# Patient Record
Sex: Male | Born: 1950 | Race: Black or African American | Hispanic: No | Marital: Married | Smoking: Current every day smoker
Health system: Southern US, Community
[De-identification: ages and names within clinical notes are randomized; demographics above are authoritative.]

## PROBLEM LIST (undated history)

## (undated) DIAGNOSIS — I482 Chronic atrial fibrillation, unspecified: Secondary | ICD-10-CM

## (undated) DIAGNOSIS — I10 Essential (primary) hypertension: Secondary | ICD-10-CM

## (undated) DIAGNOSIS — N170 Acute kidney failure with tubular necrosis: Secondary | ICD-10-CM

## (undated) DIAGNOSIS — E119 Type 2 diabetes mellitus without complications: Secondary | ICD-10-CM

## (undated) DIAGNOSIS — I5082 Biventricular heart failure: Secondary | ICD-10-CM

## (undated) DIAGNOSIS — J449 Chronic obstructive pulmonary disease, unspecified: Secondary | ICD-10-CM

## (undated) DIAGNOSIS — I2699 Other pulmonary embolism without acute cor pulmonale: Secondary | ICD-10-CM

## (undated) DIAGNOSIS — C3490 Malignant neoplasm of unspecified part of unspecified bronchus or lung: Secondary | ICD-10-CM

## (undated) DIAGNOSIS — J9621 Acute and chronic respiratory failure with hypoxia: Principal | ICD-10-CM

## (undated) DIAGNOSIS — E785 Hyperlipidemia, unspecified: Secondary | ICD-10-CM

## (undated) DIAGNOSIS — J189 Pneumonia, unspecified organism: Secondary | ICD-10-CM

## (undated) DIAGNOSIS — Y95 Nosocomial condition: Secondary | ICD-10-CM

## (undated) HISTORY — PX: APPENDECTOMY: SHX54

## (undated) HISTORY — DX: Acute kidney failure with tubular necrosis: N17.0

## (undated) HISTORY — DX: Hyperlipidemia, unspecified: E78.5

## (undated) HISTORY — DX: Pneumonia, unspecified organism: J18.9

## (undated) HISTORY — DX: Essential (primary) hypertension: I10

## (undated) HISTORY — DX: Biventricular heart failure: I50.82

## (undated) HISTORY — DX: Acute and chronic respiratory failure with hypoxia: J96.21

## (undated) HISTORY — DX: Malignant neoplasm of unspecified part of unspecified bronchus or lung: C34.90

## (undated) HISTORY — PX: OTHER SURGICAL HISTORY: SHX169

## (undated) HISTORY — PX: TRACHEOSTOMY: SUR1362

## (undated) HISTORY — DX: Chronic obstructive pulmonary disease, unspecified: J44.9

## (undated) HISTORY — DX: Nosocomial condition: Y95

## (undated) HISTORY — PX: THORACOSCOPY: SUR1347

## (undated) HISTORY — DX: Other pulmonary embolism without acute cor pulmonale: I26.99

## (undated) HISTORY — DX: Type 2 diabetes mellitus without complications: E11.9

## (undated) HISTORY — DX: Chronic atrial fibrillation, unspecified: I48.20

---

## 2010-08-16 ENCOUNTER — Ambulatory Visit: Payer: Self-pay | Admitting: Family Medicine

## 2011-08-12 IMAGING — US US EXTREM LOW VENOUS*L*
1 series · 17 of 24 positions shown · non-contrast
Comparison: none

REASON FOR EXAM: STAT CR# [id number] Eval for DVT
COMMENTS:

[Series 1: us extrem low venous*left* · 17 of 36 slices shown]
[im 1/36]
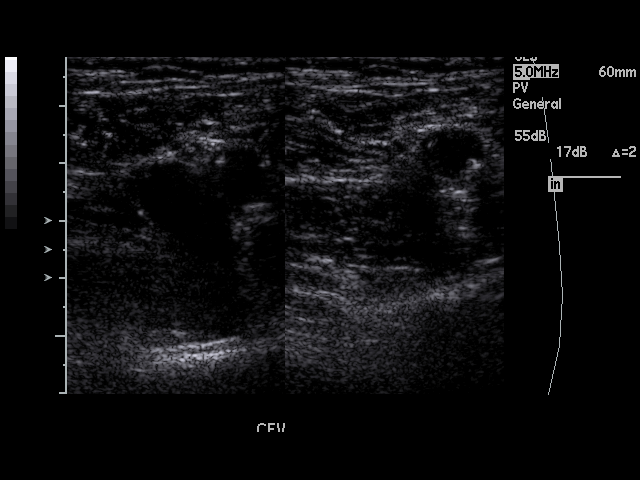
[im 4/36]
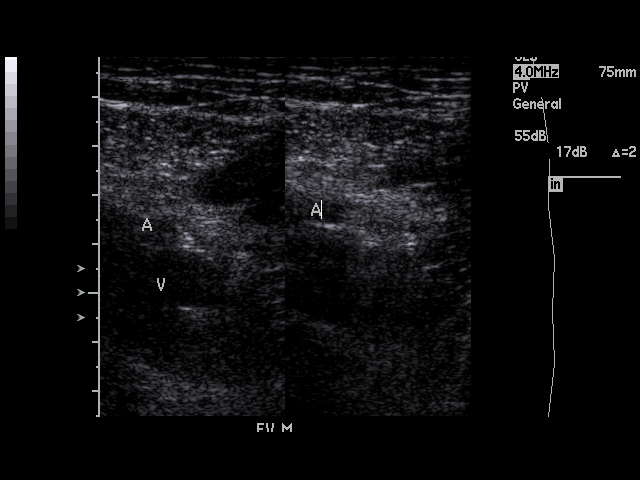
[im 5/36]
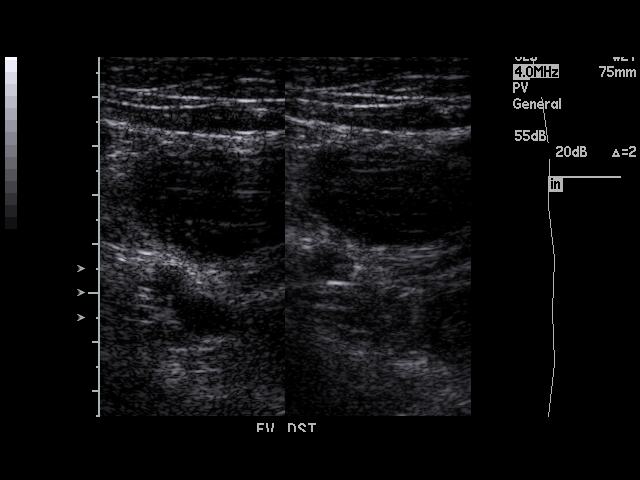
[im 7/36]
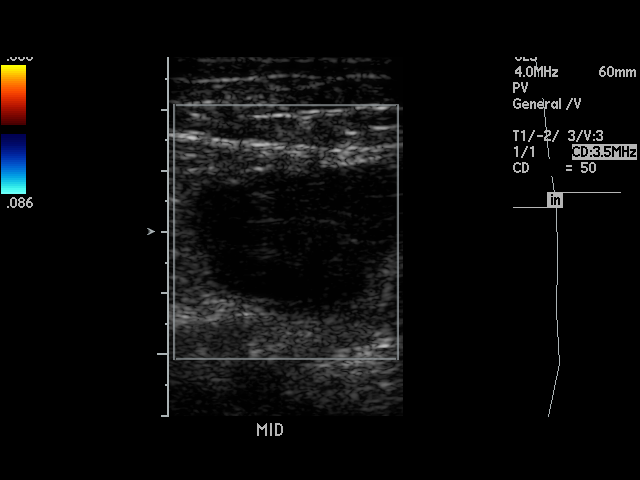
[im 10/36]
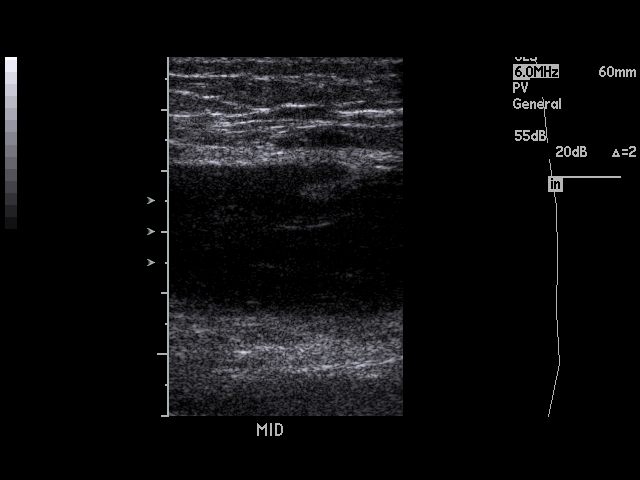
[im 11/36]
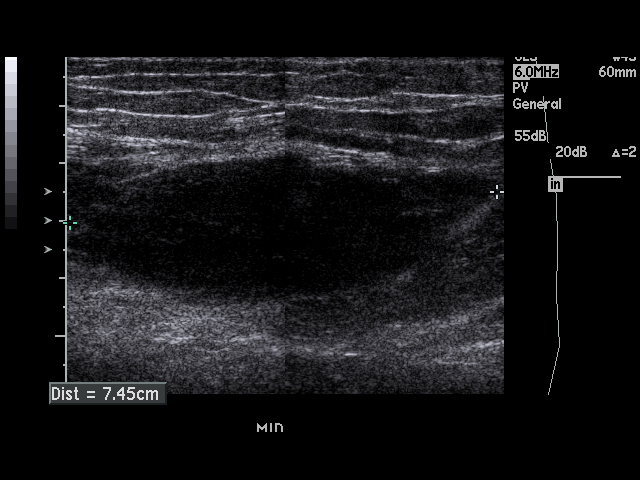
[im 14/36]
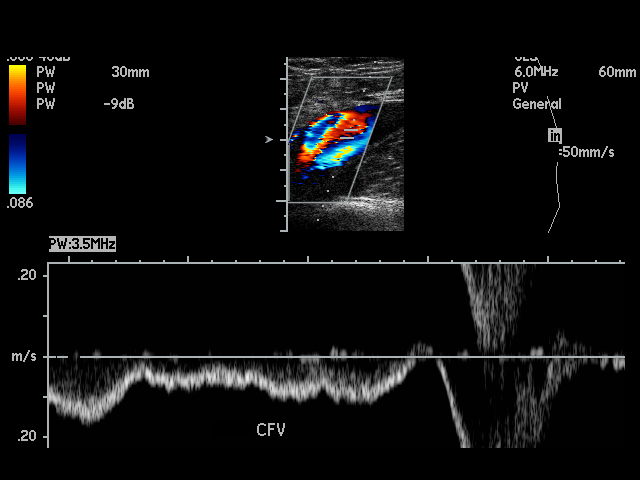
[im 16/36]
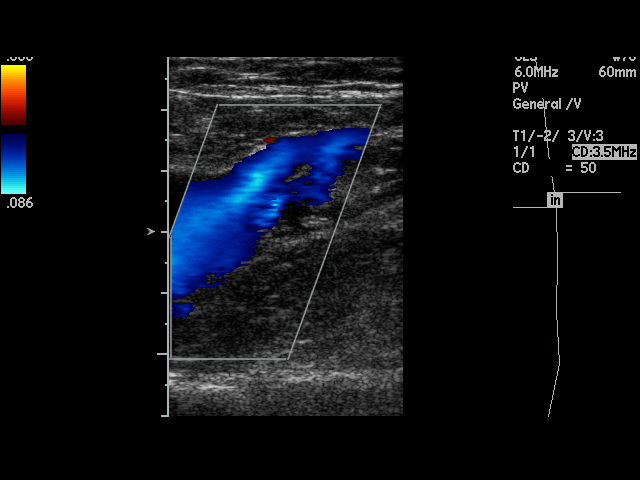
[im 19/36]
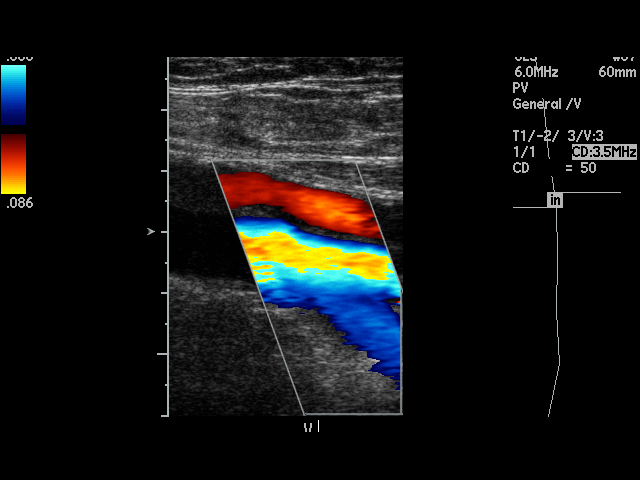
[im 20/36]
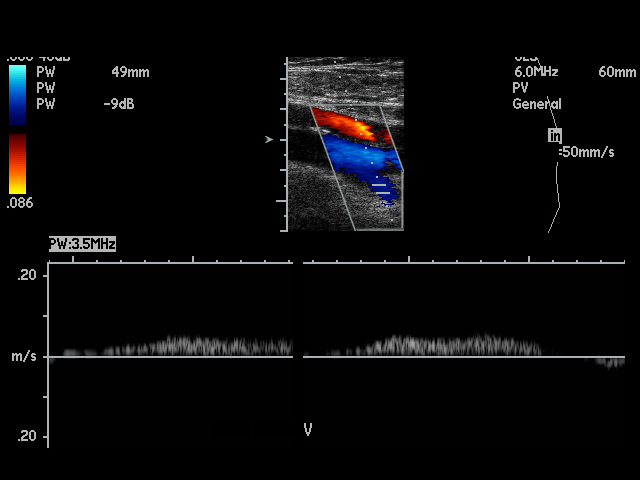
[im 22/36]
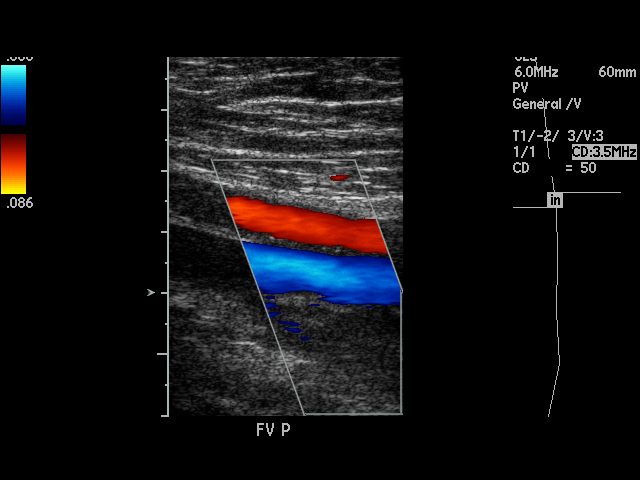
[im 25/36]
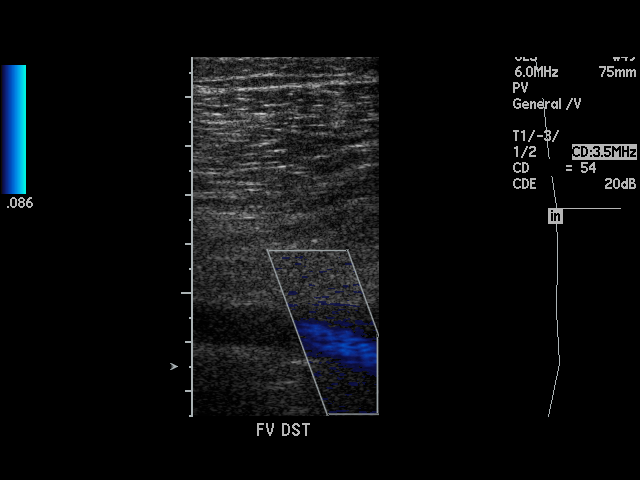
[im 26/36]
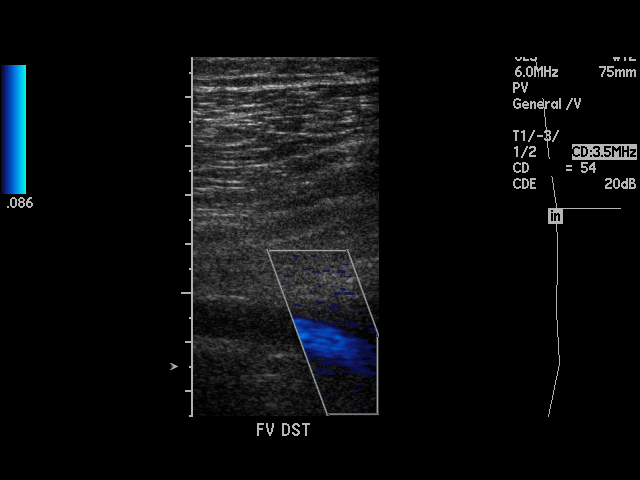
[im 29/36]
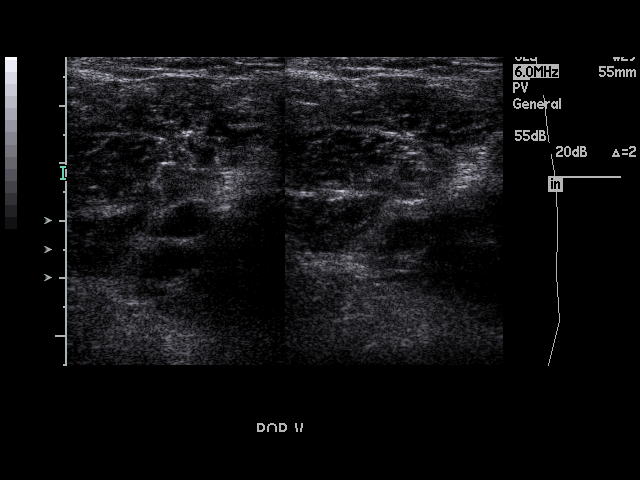
[im 31/36]
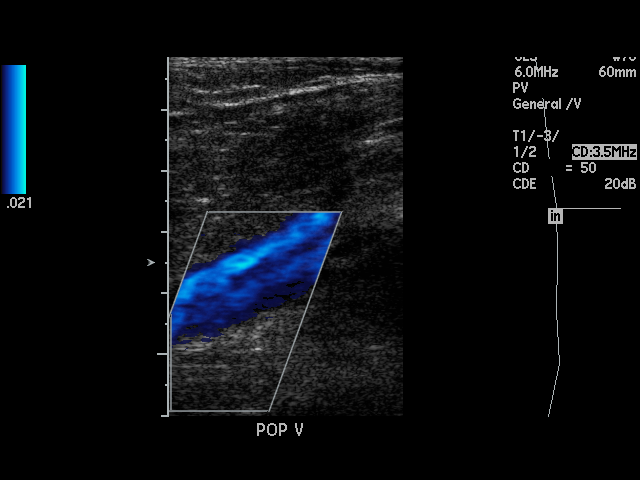
[im 32/36]
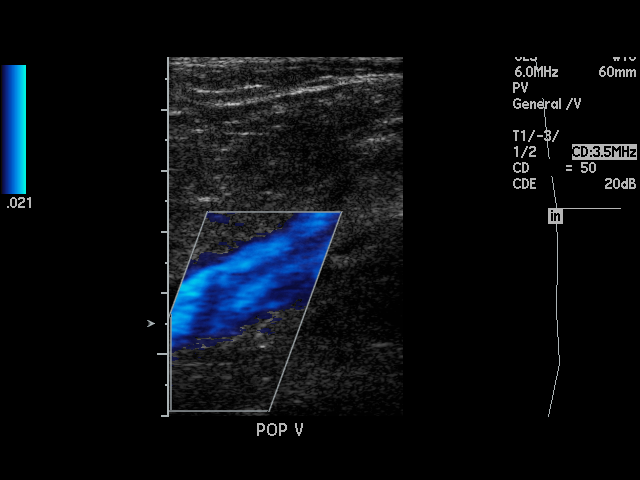
[im 36/36]
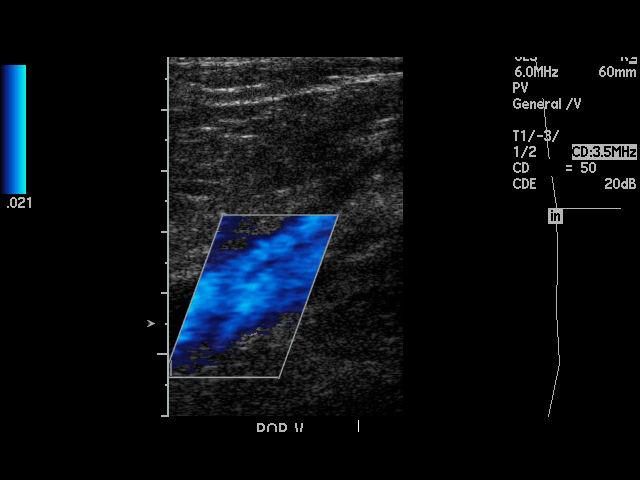

[17 of 24 positions shown; findings below may reference images not displayed]

PROCEDURE:     US  - US DOPPLER LOW EXTR LEFT  - August 16, 2010  [DATE]

RESULT:     The phasic, augmentation and Valsalva flow waveforms are normal
in appearance. The femoral and popliteal vein shows complete compressibility
throughout its course. Doppler examination shows no occlusion or evidence
for deep vein thrombosis.

In the mid thigh region there is noted a hypoechoic soft tissue mass
measuring approximately 3.65 cm x 2.32 cm x 7.45 cm longitudinally. In view
of the patient's history of recent trauma, the finding most likely
represents a fluid collection such as hematoma or serous collection.
IMPRESSION: 1. No deep vein thrombosis is identified.
2. There is what appears to be a soft tissue fluid collection as noted above.

## 2016-09-04 ENCOUNTER — Other Ambulatory Visit: Payer: Self-pay | Admitting: Family Medicine

## 2016-09-04 DIAGNOSIS — Z136 Encounter for screening for cardiovascular disorders: Secondary | ICD-10-CM

## 2018-05-13 ENCOUNTER — Encounter: Payer: Self-pay | Admitting: Internal Medicine

## 2018-05-13 ENCOUNTER — Other Ambulatory Visit (HOSPITAL_COMMUNITY): Payer: Medicare HMO | Admitting: Internal Medicine

## 2018-05-13 DIAGNOSIS — I5082 Biventricular heart failure: Secondary | ICD-10-CM

## 2018-05-13 DIAGNOSIS — I2699 Other pulmonary embolism without acute cor pulmonale: Secondary | ICD-10-CM

## 2018-05-13 DIAGNOSIS — I482 Chronic atrial fibrillation, unspecified: Secondary | ICD-10-CM | POA: Insufficient documentation

## 2018-05-13 DIAGNOSIS — Y95 Nosocomial condition: Secondary | ICD-10-CM

## 2018-05-13 DIAGNOSIS — J449 Chronic obstructive pulmonary disease, unspecified: Secondary | ICD-10-CM | POA: Diagnosis not present

## 2018-05-13 DIAGNOSIS — J9621 Acute and chronic respiratory failure with hypoxia: Secondary | ICD-10-CM

## 2018-05-13 DIAGNOSIS — J189 Pneumonia, unspecified organism: Secondary | ICD-10-CM

## 2018-05-13 DIAGNOSIS — N170 Acute kidney failure with tubular necrosis: Secondary | ICD-10-CM | POA: Diagnosis not present

## 2018-05-13 HISTORY — DX: Other pulmonary embolism without acute cor pulmonale: I26.99

## 2018-05-13 HISTORY — DX: Biventricular heart failure: I50.82

## 2018-05-13 HISTORY — DX: Acute and chronic respiratory failure with hypoxia: J96.21

## 2018-05-13 HISTORY — DX: Pneumonia, unspecified organism: J18.9

## 2018-05-13 HISTORY — DX: Acute kidney failure with tubular necrosis: N17.0

## 2018-05-13 HISTORY — DX: Chronic atrial fibrillation, unspecified: I48.20

## 2018-05-13 NOTE — Progress Notes (Signed)
[place]  Date of Service: 05/13/2018  PULMONARY CONSULT   Matthew Villarreal  GYF:[id number]  DOB: September 23, 1951     Referring Physician: Deanne Coffer, MD  HPI: Matthew Villarreal is a 67 y.o. male seen for Acute on Chronic Respiratory Failure.  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  Patient also had chemotherapy done.  Patient in addition has a history of COPD ongoing tobacco abuse.  Patient was admitted with the severe left-sided chest pain and underwent a CT angiogram.  The angiogram revealed bilateral pulmonary emboli and right heart strain.  Patient was taken to the interventional radiologist and had a attempted pulmonary embolectomy.  During initiation of procedure patient decompensated and went into pulseless electrical activity.  There was spontaneous circulation after few compression and the arm patient suffered once again 2 more brief episodes of arrest.  Subsequently procedure did over to mainly end up in the 6s with removal of the clots and improvement of the right heart strain.  Patient did require ECMO and was admitted to the ICU requiring pressors.  Patient was continued on the ventilator and eventually ended up having to have a tracheostomy.  Patient also developed acute renal failure because of the low blood pressure and was started on CRRT.  Subsequently patient was started on hemodialysis.  Patient also developed hospital-acquired pneumonia and was treated with antibiotics including cefepime and Zosyn.  Follow-up echocardiogram revealed an ejection fraction 25-35% and also right ventricular dysfunction.  Patient was presented to our facility for further management weaning.  Patient also has been on T collar trials and has done well so far.  Review of Systems:  ROS performed and is unremarkable other than noted above.  Past Medical History:  Diagnosis Date  . Acute  on chronic respiratory failure with hypoxia ([place]) 05/13/2018  . Acute renal failure due to tubular necrosis ([place]) 05/13/2018  . Atrial fibrillation, chronic ([place]) 05/13/2018  . Biventricular heart failure ([place]) 05/13/2018  . COPD (chronic obstructive pulmonary disease) ([place])   . Diabetes type 2, controlled ([place])   . Hospital-acquired pneumonia 05/13/2018  . Hyperlipidemia   . Hypertension   . Lung cancer ([place])   . Pulmonary embolism and infarction ([place]) 05/13/2018    Past Surgical History:  Procedure Laterality Date  .  right upper lobectomy    . APPENDECTOMY    . THORACOSCOPY    . TRACHEOSTOMY      Social History:    reports that he has been smoking.  He has never used smokeless tobacco. He reports that he drank alcohol. He reports that he has current or past drug history.  Family History: Non-Contributory to the present illness  Allergies  Reviewed on the [facility]  Medications: Reviewed on Rounds  Physical Exam:  Vitals:   Temperature 97.9 degrees pulse 88 respiratory 18 blood pressure 110/60 saturations 100%  Ventilator Settings  Currently off the ventilator on T collar 35% FiO2  . General: Comfortable at this time . Eyes: Grossly normal lids, irises & conjunctiva . ENT: grossly tongue is normal . Neck: no obvious mass . Cardiovascular:  S1-S2 normal no gallop or rub . Respiratory:  Scattered rhonchi no rales . Abdomen:  Soft nontender . Skin: no rash seen on limited exam . Musculoskeletal: not rigid . Psychiatric:unable to assess . Neurologic: no seizure no involuntary movements         Labs on Admission:  white count 9.6 hemoglobin 8.6 hematocrit 26.9 platelet count 260 Sodium 136 potassium 4.3 BUN 100 creatinine 5.8  Blood gas pH 7.35 pCO2 47 PO2 57  Radiological Exams on Admission:  chest x-Trystyn results were reviewed  Assessment/Plan Patient Active Problem List   Diagnosis Date Noted  . Acute on chronic respiratory failure with hypoxia ([place]) 05/13/2018  .  Pulmonary embolism and infarction ([place]) 05/13/2018  . Hospital-acquired pneumonia 05/13/2018  . Biventricular heart failure ([place]) 05/13/2018  . Acute renal failure due to tubular necrosis ([place]) 05/13/2018  . Atrial fibrillation, chronic ([place]) 05/13/2018  . COPD (chronic obstructive pulmonary disease) (H[facility])      1.  acute on chronic Respiratory failure with hypoxia continue with full support current settings patient is tolerating T collar fairly well has been on 28% FiO2.  Blood gas results as noted.  Patient's seems to have chronic hypercapnia currently is compensated.  We will monitor secretions continue aggressive pulmonary toilet overall patient prognosis remains guarded.   2. Pulmonary embolism and infarction treated had acute intervention by Radiology to remove the clots.  Doing better we will continue with present management.   3. Hospital-acquired pneumonia treated doing better we will continue follow  4. Biventricular heart failure compensated will monitor fluid status closely.   5. Acute renal failure patient's creatinine levels are still elevated followed by Nephrology for dialysis.   6. Chronic atrial fibrillation rate is controlled continue present management.   7. COPD severe disease we will continue supportive care  I have personally seen and evaluated the patient, evaluated laboratory and imaging results, formulated the assessment and plan and placed orders. The Patient requires high complexity decision making for assessment and support.  Case was discussed on Rounds with the Respiratory Therapy Staff Time Spent 67minutes  Allyne Gee, MD [facility]

## 2018-05-14 ENCOUNTER — Other Ambulatory Visit (HOSPITAL_COMMUNITY): Payer: Medicare HMO | Admitting: Internal Medicine

## 2018-05-14 DIAGNOSIS — J449 Chronic obstructive pulmonary disease, unspecified: Secondary | ICD-10-CM | POA: Diagnosis not present

## 2018-05-14 DIAGNOSIS — Y95 Nosocomial condition: Secondary | ICD-10-CM

## 2018-05-14 DIAGNOSIS — J189 Pneumonia, unspecified organism: Secondary | ICD-10-CM

## 2018-05-14 DIAGNOSIS — N170 Acute kidney failure with tubular necrosis: Secondary | ICD-10-CM

## 2018-05-14 DIAGNOSIS — I482 Chronic atrial fibrillation, unspecified: Secondary | ICD-10-CM

## 2018-05-14 DIAGNOSIS — I5082 Biventricular heart failure: Secondary | ICD-10-CM | POA: Diagnosis not present

## 2018-05-14 DIAGNOSIS — I2699 Other pulmonary embolism without acute cor pulmonale: Secondary | ICD-10-CM

## 2018-05-14 DIAGNOSIS — J9621 Acute and chronic respiratory failure with hypoxia: Secondary | ICD-10-CM | POA: Diagnosis not present

## 2018-05-14 NOTE — Progress Notes (Signed)
[place] NOTE  PULMONARY SERVICE ROUNDS  Date of Service: 05/14/2018  Matthew Villarreal  DOB: 27-Sep-1951  Referring physician: Deanne Coffer, MD  HPI: Matthew Villarreal is a 67 y.o. male  being seen for Acute on Chronic Respiratory Failure.  Patient is resting comfortably on T collar currently on 35% FiO2 patient is weaning tolerating it well.  Review of Systems: Unremarkable other than noted in HPI  Allergies:  Reviewed on the [facility]  Medications: Reviewed  Vitals: Temperature 97.6 pulse 93 respiratory rate 16 blood pressure 110/60 saturations 100%  Ventilator Settings: Off the ventilator on T collar  Physical Exam: . General:  calm and comfortable NAD . Eyes: normal lids, irises & conjunctiva . ENT: grossly normal tongue not enlarged . Neck: no masses . Cardiovascular: S1 S2 Normal no rubs no gallop . Respiratory: Coarse rhonchi noted bilaterally . Abdomen: soft non-distended . Skin: no rash seen on limited exam . Musculoskeletal:  no rigidity . Psychiatric: unable to assess . Neurologic: no involuntary movements          Lab Data and radiological Data:  Data reviewed   Assessment/Plan  Patient Active Problem List   Diagnosis Date Noted  . Acute on chronic respiratory failure with hypoxia ([place]) 05/13/2018  . Pulmonary embolism and infarction ([place]) 05/13/2018  . Hospital-acquired pneumonia 05/13/2018  . Biventricular heart failure ([place]) 05/13/2018  . Acute renal failure due to tubular necrosis ([place]) 05/13/2018  . Atrial fibrillation, chronic ([place]) 05/13/2018  . COPD (chronic obstructive pulmonary disease) ([place])       1. Acute on chronic respiratory failure with hypoxia we will continue with T collar and wean as tolerated.  Patient was discussed on rounds has been doing well so far. 2. Pulmonary embolism infarction stable we will continue present management. 3. Hospital-acquired pneumonia treated we will continue to follow 4. Biventricular  failure patient's compensated right now 5. Chronic atrial fibrillation rate is controlled 6. Acute renal failure follow labs nephrology recommendations 7. COPD advanced disease continue present management   I have personally evaluated the patient, evaluated the laboratory and imaging results and formulated the assessment and plan and placed orders as needed. The Patient requires high complexity decision making for assessment and support. I have discussed the patient on rounds with the Respiratory Staff   Allyne Gee, MD [facility] Pulmonary Critical Care Medicine

## 2018-05-15 ENCOUNTER — Other Ambulatory Visit (HOSPITAL_COMMUNITY): Payer: Medicare HMO | Admitting: Internal Medicine

## 2018-05-15 DIAGNOSIS — I482 Chronic atrial fibrillation, unspecified: Secondary | ICD-10-CM

## 2018-05-15 DIAGNOSIS — I2699 Other pulmonary embolism without acute cor pulmonale: Secondary | ICD-10-CM | POA: Diagnosis not present

## 2018-05-15 DIAGNOSIS — J449 Chronic obstructive pulmonary disease, unspecified: Secondary | ICD-10-CM

## 2018-05-15 DIAGNOSIS — I5082 Biventricular heart failure: Secondary | ICD-10-CM

## 2018-05-15 DIAGNOSIS — J9621 Acute and chronic respiratory failure with hypoxia: Secondary | ICD-10-CM

## 2018-05-15 DIAGNOSIS — J189 Pneumonia, unspecified organism: Secondary | ICD-10-CM

## 2018-05-15 DIAGNOSIS — N170 Acute kidney failure with tubular necrosis: Secondary | ICD-10-CM

## 2018-05-15 DIAGNOSIS — Y95 Nosocomial condition: Secondary | ICD-10-CM

## 2018-05-15 NOTE — Progress Notes (Signed)
[place] NOTE  PULMONARY SERVICE ROUNDS  Date of Service: 05/15/2018  [name]  DOB: 07/09/1951  Referring physician: Deanne Coffer, MD  HPI: Matthew Villarreal is a 67 y.o. male  being seen for Acute on Chronic Respiratory Failure.  Patient is on T collar has been tolerating the PMV fairly well.  Currently patient has a cuffless trach in place.  Secretions are moderate.  Review of Systems: Unremarkable other than noted in HPI  Allergies:  Reviewed on the [facility]  Medications: Reviewed  Vitals: Temperature 97.5 pulse 75 respiratory rate 16 blood pressure 94/57 saturations 95%  Ventilator Settings: Off of the ventilator on 28% FiO2 on T collar  Physical Exam: . General:  calm and comfortable NAD . Eyes: normal lids, irises & conjunctiva . ENT: grossly normal tongue not enlarged . Neck: no masses . Cardiovascular: S1 S2 Normal no rubs no gallop . Respiratory: No rhonchi or rales are noted at this time. . Abdomen: soft non-distended . Skin: no rash seen on limited exam . Musculoskeletal:  no rigidity . Psychiatric: unable to assess . Neurologic: no involuntary movements          Lab Data and radiological Data:  Sodium 135 potassium 3.8 BUN 99 creatinine 6.2 White count 9.6 hemoglobin 8.8 hematocrit 27.5 platelet count 247   Assessment/Plan  Patient Active Problem List   Diagnosis Date Noted  . Acute on chronic respiratory failure with hypoxia ([place]) 05/13/2018  . Pulmonary embolism and infarction ([place]) 05/13/2018  . Hospital-acquired pneumonia 05/13/2018  . Biventricular heart failure ([place]) 05/13/2018  . Acute renal failure due to tubular necrosis ([place]) 05/13/2018  . Atrial fibrillation, chronic ([place]) 05/13/2018  . COPD (chronic obstructive pulmonary disease) ([place])       1. Acute on chronic respiratory failure with hypoxia patient continues to have good tolerance of T collar.  Secretions are still significant however.  Discussed on rounds  the possibility of adding scopolamine.  We will continue with supportive care continue pulmonary toilet monitor secretions changes. 2. Chronic atrial fibrillation rate is controlled 3. Biventricular failure continue present management 4. Hospital-acquired pneumonia treated we will continue supportive care 5. Pulmonary embolism and infarction patient is at baseline treated 6. COPD severe disease continue present management prognosis guarded 7. Acute renal failure with tubular necrosis labs as noted above.  Nephrology recommendations.   I have personally evaluated the patient, evaluated the laboratory and imaging results and formulated the assessment and plan and placed orders as needed. The Patient requires high complexity decision making for assessment and support. I have discussed the patient on rounds with the Respiratory Staff   Allyne Gee, MD [facility] Pulmonary Critical Care Medicine

## 2018-05-16 ENCOUNTER — Other Ambulatory Visit (HOSPITAL_COMMUNITY): Payer: Medicare HMO | Admitting: Internal Medicine

## 2018-05-16 DIAGNOSIS — I482 Chronic atrial fibrillation, unspecified: Secondary | ICD-10-CM

## 2018-05-16 DIAGNOSIS — J189 Pneumonia, unspecified organism: Secondary | ICD-10-CM

## 2018-05-16 DIAGNOSIS — I2699 Other pulmonary embolism without acute cor pulmonale: Secondary | ICD-10-CM

## 2018-05-16 DIAGNOSIS — N170 Acute kidney failure with tubular necrosis: Secondary | ICD-10-CM

## 2018-05-16 DIAGNOSIS — I5082 Biventricular heart failure: Secondary | ICD-10-CM | POA: Diagnosis not present

## 2018-05-16 DIAGNOSIS — J9621 Acute and chronic respiratory failure with hypoxia: Secondary | ICD-10-CM

## 2018-05-16 DIAGNOSIS — J449 Chronic obstructive pulmonary disease, unspecified: Secondary | ICD-10-CM

## 2018-05-16 DIAGNOSIS — Y95 Nosocomial condition: Secondary | ICD-10-CM

## 2018-05-16 NOTE — Progress Notes (Signed)
[place] NOTE  PULMONARY SERVICE ROUNDS  Date of Service: 05/16/2018  Matthew Villarreal  DOB: 1951-08-11  Referring physician: Deanne Coffer, MD  HPI: Matthew Villarreal is a 67 y.o. male  being seen for Acute on Chronic Respiratory Failure.  Capping doing well.  Patient's been on 2 L at this time.  Review of Systems: Unremarkable other than noted in HPI  Allergies:  Reviewed on the [facility]  Medications: Reviewed  Vitals: Temperature 98.4 pulse 76 respiratory rate 16 blood pressure 118/62 saturations 100%  Ventilator Settings: Capping without distress  Physical Exam: . General:  calm and comfortable NAD . Eyes: normal lids, irises & conjunctiva . ENT: grossly normal tongue not enlarged . Neck: no masses . Cardiovascular: S1 S2 Normal no rubs no gallop . Respiratory: No rhonchi or rales . Abdomen: soft non-distended . Skin: no rash seen on limited exam . Musculoskeletal:  no rigidity . Psychiatric: unable to assess . Neurologic: no involuntary movements          Lab Data and radiological Data:  Data reviewed   Assessment/Plan  Patient Active Problem List   Diagnosis Date Noted  . Acute on chronic respiratory failure with hypoxia ([place]) 05/13/2018  . Pulmonary embolism and infarction ([place]) 05/13/2018  . Hospital-acquired pneumonia 05/13/2018  . Biventricular heart failure ([place]) 05/13/2018  . Acute renal failure due to tubular necrosis ([place]) 05/13/2018  . Atrial fibrillation, chronic ([place]) 05/13/2018  . COPD (chronic obstructive pulmonary disease) ([place])       1. Acute on chronic respiratory failure with hypoxia we will continue present management.  Patient is doing fine with capping hopefully will be moving towards decannulation soon. 2. Pulmonary embolism treated we will continue present management. 3. Hospital course treated we will continue supportive care. 4. Biventricular failure right now is compensated 5. Chronic atrial fibrillation rate  is controlled 6. Acute renal failure follow-up labs 7. COPD severe disease continue present management   I have personally evaluated the patient, evaluated the laboratory and imaging results and formulated the assessment and plan and placed orders as needed. The Patient requires high complexity decision making for assessment and support. I have discussed the patient on rounds with the Respiratory Staff   Allyne Gee, MD [facility] Pulmonary Critical Care Medicine

## 2018-05-17 ENCOUNTER — Other Ambulatory Visit (INDEPENDENT_AMBULATORY_CARE_PROVIDER_SITE_OTHER): Payer: Medicare HMO | Admitting: Internal Medicine

## 2018-05-17 DIAGNOSIS — J449 Chronic obstructive pulmonary disease, unspecified: Secondary | ICD-10-CM

## 2018-05-17 DIAGNOSIS — J9621 Acute and chronic respiratory failure with hypoxia: Secondary | ICD-10-CM | POA: Diagnosis not present

## 2018-05-17 DIAGNOSIS — N170 Acute kidney failure with tubular necrosis: Secondary | ICD-10-CM | POA: Diagnosis not present

## 2018-05-17 DIAGNOSIS — J189 Pneumonia, unspecified organism: Secondary | ICD-10-CM | POA: Diagnosis not present

## 2018-05-17 DIAGNOSIS — Y95 Nosocomial condition: Secondary | ICD-10-CM

## 2018-05-17 DIAGNOSIS — I482 Chronic atrial fibrillation, unspecified: Secondary | ICD-10-CM

## 2018-05-17 DIAGNOSIS — I2699 Other pulmonary embolism without acute cor pulmonale: Secondary | ICD-10-CM

## 2018-05-17 NOTE — Progress Notes (Signed)
[place] NOTE  PULMONARY SERVICE ROUNDS  Date of Service: 05/17/2018  Matthew Villarreal  DOB: 07/13/1951  Referring physician: Deanne Coffer, MD  HPI: Matthew Villarreal is a 67 y.o. male  being seen for Acute on Chronic Respiratory Failure.  Patient is capping right now comfortable without distress.  Review of Systems: Unremarkable other than noted in HPI  Allergies:  Reviewed on the [facility]  Medications: Reviewed  Vitals: Temperature 96.8 pulse 93 respiratory rate 19 blood pressure 90/60 saturations 96%  Ventilator Settings: Currently is off the ventilator capping  Physical Exam: . General:  calm and comfortable NAD . Eyes: normal lids, irises & conjunctiva . ENT: grossly normal tongue not enlarged . Neck: no masses . Cardiovascular: S1 S2 Normal no rubs no gallop . Respiratory: No rhonchi or rales . Abdomen: soft non-distended . Skin: no rash seen on limited exam . Musculoskeletal:  no rigidity . Psychiatric: unable to assess . Neurologic: no involuntary movements          Lab Data and radiological Data:  Sodium 133 potassium 3.9 BUN 87 creatinine 6.9 White count 11.8 hemoglobin 9.2 hematocrit 28 platelet count 251   Assessment/Plan  Patient Active Problem List   Diagnosis Date Noted  . Acute on chronic respiratory failure with hypoxia ([place]) 05/13/2018  . Pulmonary embolism and infarction ([place]) 05/13/2018  . Hospital-acquired pneumonia 05/13/2018  . Biventricular heart failure ([place]) 05/13/2018  . Acute renal failure due to tubular necrosis ([place]) 05/13/2018  . Atrial fibrillation, chronic ([place]) 05/13/2018  . COPD (chronic obstructive pulmonary disease) ([place])       1. Acute on chronic respiratory failure with hypoxia we will continue with capping trials continue pulmonary toilet supportive care. 2. Pulmonary embolism infarction stable we will continue present management. 3. Hospital-acquired pneumonia antibiotics we will continue to  follow 4. Biventricular failure comfortable no distress 5. Acute renal failure patient is followed by 20 6. Chronic atrial fibrillation rate is controlled 7. COPD severe disease continue present management   I have personally evaluated the patient, evaluated the laboratory and imaging results and formulated the assessment and plan and placed orders as needed. The Patient requires high complexity decision making for assessment and support. I have discussed the patient on rounds with the Respiratory Staff   Allyne Gee, MD [facility] Pulmonary Critical Care Medicine

## 2018-05-19 ENCOUNTER — Other Ambulatory Visit (HOSPITAL_COMMUNITY): Payer: Medicare HMO | Admitting: Internal Medicine

## 2018-05-19 DIAGNOSIS — J189 Pneumonia, unspecified organism: Secondary | ICD-10-CM

## 2018-05-19 DIAGNOSIS — I2699 Other pulmonary embolism without acute cor pulmonale: Secondary | ICD-10-CM

## 2018-05-19 DIAGNOSIS — I482 Chronic atrial fibrillation, unspecified: Secondary | ICD-10-CM

## 2018-05-19 DIAGNOSIS — N170 Acute kidney failure with tubular necrosis: Secondary | ICD-10-CM

## 2018-05-19 DIAGNOSIS — J9621 Acute and chronic respiratory failure with hypoxia: Secondary | ICD-10-CM | POA: Diagnosis not present

## 2018-05-19 DIAGNOSIS — J449 Chronic obstructive pulmonary disease, unspecified: Secondary | ICD-10-CM

## 2018-05-19 DIAGNOSIS — I5082 Biventricular heart failure: Secondary | ICD-10-CM

## 2018-05-19 DIAGNOSIS — Y95 Nosocomial condition: Secondary | ICD-10-CM

## 2018-05-19 NOTE — Progress Notes (Signed)
[place] NOTE  PULMONARY SERVICE ROUNDS  Date of Service: 05/19/2018  [name]  DOB: [birth year]  Referring physician: Deanne Coffer, MD  HPI: Mendell Bontempo is a 67 y.o. male  being seen for Acute on Chronic Respiratory Failure.  Patient was decannulated without distress at this time.  Comfortable  Review of Systems: Unremarkable other than noted in HPI  Allergies:  Reviewed on the [facility]  Medications: Reviewed  Vitals: Temperature 98.3 pulse 86 respiratory rate 16 blood pressure 9158 saturations 93%  Ventilator Settings: D cannulated  Physical Exam: . General:  calm and comfortable NAD . Eyes: normal lids, irises & conjunctiva . ENT: grossly normal tongue not enlarged . Neck: no masses . Cardiovascular: S1 S2 Normal no rubs no gallop . Respiratory: No rhonchi or rales . Abdomen: soft non-distended . Skin: no rash seen on limited exam . Musculoskeletal:  no rigidity . Psychiatric: unable to assess . Neurologic: no involuntary movements          Lab Data and radiological Data:  No labs to report   Assessment/Plan  Patient Active Problem List   Diagnosis Date Noted  . Acute on chronic respiratory failure with hypoxia ([place]) 05/13/2018  . Pulmonary embolism and infarction ([place]) 05/13/2018  . Hospital-acquired pneumonia 05/13/2018  . Biventricular heart failure ([place]) 05/13/2018  . Acute renal failure due to tubular necrosis ([place]) 05/13/2018  . Atrial fibrillation, chronic ([place]) 05/13/2018  . COPD (chronic obstructive pulmonary disease) ([place])       1. Acute on chronic respiratory failure with hypoxia we will continue with supportive care at this time treated as tolerated. 2. Pulmonary embolism treated 3. Hospital-acquired pneumonia treated clinically improving 4. Biventricular failure stable 5. Acute renal failure follow labs  6. chronic atrial fibrillation rate is controlled 7. COPD severe disease stable   I have personally  evaluated the patient, evaluated the laboratory and imaging results and formulated the assessment and plan and placed orders as needed. The Patient requires high complexity decision making for assessment and support. I have discussed the patient on rounds with the Respiratory Staff   Allyne Gee, MD [facility] Pulmonary Critical Care Medicine

## 2019-11-03 DIAGNOSIS — C3412 Malignant neoplasm of upper lobe, left bronchus or lung: Secondary | ICD-10-CM | POA: Diagnosis not present

## 2019-11-04 DIAGNOSIS — C3412 Malignant neoplasm of upper lobe, left bronchus or lung: Secondary | ICD-10-CM | POA: Diagnosis not present

## 2019-11-05 DIAGNOSIS — C3412 Malignant neoplasm of upper lobe, left bronchus or lung: Secondary | ICD-10-CM | POA: Diagnosis not present

## 2019-11-06 DIAGNOSIS — C3412 Malignant neoplasm of upper lobe, left bronchus or lung: Secondary | ICD-10-CM | POA: Diagnosis not present

## 2019-11-07 DIAGNOSIS — C3412 Malignant neoplasm of upper lobe, left bronchus or lung: Secondary | ICD-10-CM | POA: Diagnosis not present

## 2019-11-10 DIAGNOSIS — C3412 Malignant neoplasm of upper lobe, left bronchus or lung: Secondary | ICD-10-CM | POA: Diagnosis not present

## 2019-11-11 DIAGNOSIS — C3412 Malignant neoplasm of upper lobe, left bronchus or lung: Secondary | ICD-10-CM | POA: Diagnosis not present

## 2019-11-12 DIAGNOSIS — C3412 Malignant neoplasm of upper lobe, left bronchus or lung: Secondary | ICD-10-CM | POA: Diagnosis not present

## 2019-11-13 DIAGNOSIS — C3412 Malignant neoplasm of upper lobe, left bronchus or lung: Secondary | ICD-10-CM | POA: Diagnosis not present

## 2019-11-14 DIAGNOSIS — C3412 Malignant neoplasm of upper lobe, left bronchus or lung: Secondary | ICD-10-CM | POA: Diagnosis not present

## 2019-11-18 DIAGNOSIS — C3412 Malignant neoplasm of upper lobe, left bronchus or lung: Secondary | ICD-10-CM | POA: Diagnosis not present

## 2019-11-20 DIAGNOSIS — R918 Other nonspecific abnormal finding of lung field: Secondary | ICD-10-CM | POA: Diagnosis not present

## 2019-11-20 DIAGNOSIS — C3412 Malignant neoplasm of upper lobe, left bronchus or lung: Secondary | ICD-10-CM | POA: Diagnosis not present

## 2019-11-20 DIAGNOSIS — C3411 Malignant neoplasm of upper lobe, right bronchus or lung: Secondary | ICD-10-CM | POA: Diagnosis not present

## 2019-11-20 DIAGNOSIS — C3491 Malignant neoplasm of unspecified part of right bronchus or lung: Secondary | ICD-10-CM | POA: Diagnosis not present

## 2019-11-20 DIAGNOSIS — R590 Localized enlarged lymph nodes: Secondary | ICD-10-CM | POA: Diagnosis not present

## 2019-11-20 DIAGNOSIS — Z79899 Other long term (current) drug therapy: Secondary | ICD-10-CM | POA: Diagnosis not present

## 2019-11-20 DIAGNOSIS — Z923 Personal history of irradiation: Secondary | ICD-10-CM | POA: Diagnosis not present

## 2019-11-20 DIAGNOSIS — R69 Illness, unspecified: Secondary | ICD-10-CM | POA: Diagnosis not present

## 2019-11-20 DIAGNOSIS — R0602 Shortness of breath: Secondary | ICD-10-CM | POA: Diagnosis not present

## 2019-11-20 DIAGNOSIS — R05 Cough: Secondary | ICD-10-CM | POA: Diagnosis not present

## 2019-11-28 DIAGNOSIS — Z5112 Encounter for antineoplastic immunotherapy: Secondary | ICD-10-CM | POA: Diagnosis not present

## 2019-11-28 DIAGNOSIS — R0602 Shortness of breath: Secondary | ICD-10-CM | POA: Diagnosis not present

## 2019-11-28 DIAGNOSIS — Z5111 Encounter for antineoplastic chemotherapy: Secondary | ICD-10-CM | POA: Diagnosis not present

## 2019-11-28 DIAGNOSIS — C3491 Malignant neoplasm of unspecified part of right bronchus or lung: Secondary | ICD-10-CM | POA: Diagnosis not present

## 2019-11-28 DIAGNOSIS — R69 Illness, unspecified: Secondary | ICD-10-CM | POA: Diagnosis not present

## 2019-11-28 DIAGNOSIS — Z923 Personal history of irradiation: Secondary | ICD-10-CM | POA: Diagnosis not present

## 2019-11-28 DIAGNOSIS — C3411 Malignant neoplasm of upper lobe, right bronchus or lung: Secondary | ICD-10-CM | POA: Diagnosis not present

## 2019-11-28 DIAGNOSIS — C3412 Malignant neoplasm of upper lobe, left bronchus or lung: Secondary | ICD-10-CM | POA: Diagnosis not present

## 2019-11-28 DIAGNOSIS — R05 Cough: Secondary | ICD-10-CM | POA: Diagnosis not present

## 2019-11-28 DIAGNOSIS — R197 Diarrhea, unspecified: Secondary | ICD-10-CM | POA: Diagnosis not present

## 2019-12-11 DIAGNOSIS — I10 Essential (primary) hypertension: Secondary | ICD-10-CM | POA: Diagnosis not present

## 2019-12-11 DIAGNOSIS — R0989 Other specified symptoms and signs involving the circulatory and respiratory systems: Secondary | ICD-10-CM | POA: Diagnosis not present

## 2019-12-11 DIAGNOSIS — E119 Type 2 diabetes mellitus without complications: Secondary | ICD-10-CM | POA: Diagnosis not present

## 2019-12-11 DIAGNOSIS — I2721 Secondary pulmonary arterial hypertension: Secondary | ICD-10-CM | POA: Diagnosis not present

## 2019-12-11 DIAGNOSIS — N183 Chronic kidney disease, stage 3 unspecified: Secondary | ICD-10-CM | POA: Diagnosis not present

## 2019-12-11 DIAGNOSIS — E1122 Type 2 diabetes mellitus with diabetic chronic kidney disease: Secondary | ICD-10-CM | POA: Diagnosis not present

## 2019-12-11 DIAGNOSIS — E782 Mixed hyperlipidemia: Secondary | ICD-10-CM | POA: Diagnosis not present

## 2019-12-11 DIAGNOSIS — I480 Paroxysmal atrial fibrillation: Secondary | ICD-10-CM | POA: Diagnosis not present

## 2019-12-11 DIAGNOSIS — Z8589 Personal history of malignant neoplasm of other organs and systems: Secondary | ICD-10-CM | POA: Diagnosis not present

## 2019-12-11 DIAGNOSIS — J438 Other emphysema: Secondary | ICD-10-CM | POA: Diagnosis not present

## 2019-12-26 DIAGNOSIS — C3491 Malignant neoplasm of unspecified part of right bronchus or lung: Secondary | ICD-10-CM | POA: Diagnosis not present

## 2019-12-26 DIAGNOSIS — C3411 Malignant neoplasm of upper lobe, right bronchus or lung: Secondary | ICD-10-CM | POA: Diagnosis not present

## 2019-12-26 DIAGNOSIS — R7401 Elevation of levels of liver transaminase levels: Secondary | ICD-10-CM | POA: Diagnosis not present

## 2019-12-26 DIAGNOSIS — Z5112 Encounter for antineoplastic immunotherapy: Secondary | ICD-10-CM | POA: Diagnosis not present

## 2019-12-26 DIAGNOSIS — C3412 Malignant neoplasm of upper lobe, left bronchus or lung: Secondary | ICD-10-CM | POA: Diagnosis not present

## 2019-12-26 DIAGNOSIS — Z5111 Encounter for antineoplastic chemotherapy: Secondary | ICD-10-CM | POA: Diagnosis not present

## 2019-12-31 DIAGNOSIS — I10 Essential (primary) hypertension: Secondary | ICD-10-CM | POA: Diagnosis not present

## 2019-12-31 DIAGNOSIS — R69 Illness, unspecified: Secondary | ICD-10-CM | POA: Diagnosis not present

## 2019-12-31 DIAGNOSIS — E119 Type 2 diabetes mellitus without complications: Secondary | ICD-10-CM | POA: Diagnosis not present

## 2019-12-31 DIAGNOSIS — Z794 Long term (current) use of insulin: Secondary | ICD-10-CM | POA: Diagnosis not present

## 2020-01-08 DIAGNOSIS — Z5112 Encounter for antineoplastic immunotherapy: Secondary | ICD-10-CM | POA: Diagnosis not present

## 2020-01-08 DIAGNOSIS — Z5111 Encounter for antineoplastic chemotherapy: Secondary | ICD-10-CM | POA: Diagnosis not present

## 2020-01-08 DIAGNOSIS — C3491 Malignant neoplasm of unspecified part of right bronchus or lung: Secondary | ICD-10-CM | POA: Diagnosis not present

## 2020-01-08 DIAGNOSIS — R7401 Elevation of levels of liver transaminase levels: Secondary | ICD-10-CM | POA: Diagnosis not present

## 2020-01-08 DIAGNOSIS — C3411 Malignant neoplasm of upper lobe, right bronchus or lung: Secondary | ICD-10-CM | POA: Diagnosis not present

## 2020-01-08 DIAGNOSIS — C3412 Malignant neoplasm of upper lobe, left bronchus or lung: Secondary | ICD-10-CM | POA: Diagnosis not present

## 2020-01-23 DIAGNOSIS — Z5112 Encounter for antineoplastic immunotherapy: Secondary | ICD-10-CM | POA: Diagnosis not present

## 2020-01-23 DIAGNOSIS — Z902 Acquired absence of lung [part of]: Secondary | ICD-10-CM | POA: Diagnosis not present

## 2020-01-23 DIAGNOSIS — R69 Illness, unspecified: Secondary | ICD-10-CM | POA: Diagnosis not present

## 2020-01-23 DIAGNOSIS — C3412 Malignant neoplasm of upper lobe, left bronchus or lung: Secondary | ICD-10-CM | POA: Diagnosis not present

## 2020-01-23 DIAGNOSIS — C3411 Malignant neoplasm of upper lobe, right bronchus or lung: Secondary | ICD-10-CM | POA: Diagnosis not present

## 2020-02-19 DIAGNOSIS — C3412 Malignant neoplasm of upper lobe, left bronchus or lung: Secondary | ICD-10-CM | POA: Diagnosis not present

## 2020-02-19 DIAGNOSIS — C3411 Malignant neoplasm of upper lobe, right bronchus or lung: Secondary | ICD-10-CM | POA: Diagnosis not present

## 2020-02-19 DIAGNOSIS — R69 Illness, unspecified: Secondary | ICD-10-CM | POA: Diagnosis not present

## 2020-02-19 DIAGNOSIS — Z902 Acquired absence of lung [part of]: Secondary | ICD-10-CM | POA: Diagnosis not present

## 2020-02-19 DIAGNOSIS — Z5112 Encounter for antineoplastic immunotherapy: Secondary | ICD-10-CM | POA: Diagnosis not present

## 2020-03-18 DIAGNOSIS — C3412 Malignant neoplasm of upper lobe, left bronchus or lung: Secondary | ICD-10-CM | POA: Diagnosis not present

## 2020-03-18 DIAGNOSIS — C3491 Malignant neoplasm of unspecified part of right bronchus or lung: Secondary | ICD-10-CM | POA: Diagnosis not present

## 2020-03-18 DIAGNOSIS — Z5111 Encounter for antineoplastic chemotherapy: Secondary | ICD-10-CM | POA: Diagnosis not present

## 2020-03-18 DIAGNOSIS — B370 Candidal stomatitis: Secondary | ICD-10-CM | POA: Diagnosis not present

## 2020-03-18 DIAGNOSIS — D485 Neoplasm of uncertain behavior of skin: Secondary | ICD-10-CM | POA: Diagnosis not present

## 2020-03-18 DIAGNOSIS — Z5112 Encounter for antineoplastic immunotherapy: Secondary | ICD-10-CM | POA: Diagnosis not present

## 2020-03-18 DIAGNOSIS — M79673 Pain in unspecified foot: Secondary | ICD-10-CM | POA: Diagnosis not present

## 2020-03-18 DIAGNOSIS — R21 Rash and other nonspecific skin eruption: Secondary | ICD-10-CM | POA: Diagnosis not present

## 2020-03-18 DIAGNOSIS — C3411 Malignant neoplasm of upper lobe, right bronchus or lung: Secondary | ICD-10-CM | POA: Diagnosis not present

## 2020-03-18 DIAGNOSIS — M79643 Pain in unspecified hand: Secondary | ICD-10-CM | POA: Diagnosis not present

## 2020-03-31 DIAGNOSIS — L270 Generalized skin eruption due to drugs and medicaments taken internally: Secondary | ICD-10-CM | POA: Diagnosis not present

## 2020-05-10 DIAGNOSIS — R234 Changes in skin texture: Secondary | ICD-10-CM | POA: Diagnosis not present

## 2020-05-10 DIAGNOSIS — C3412 Malignant neoplasm of upper lobe, left bronchus or lung: Secondary | ICD-10-CM | POA: Diagnosis not present

## 2020-05-10 DIAGNOSIS — E1165 Type 2 diabetes mellitus with hyperglycemia: Secondary | ICD-10-CM | POA: Diagnosis not present

## 2020-05-10 DIAGNOSIS — C3411 Malignant neoplasm of upper lobe, right bronchus or lung: Secondary | ICD-10-CM | POA: Diagnosis not present

## 2020-05-10 DIAGNOSIS — N1832 Chronic kidney disease, stage 3b: Secondary | ICD-10-CM | POA: Diagnosis not present

## 2020-05-10 DIAGNOSIS — C3491 Malignant neoplasm of unspecified part of right bronchus or lung: Secondary | ICD-10-CM | POA: Diagnosis not present

## 2020-05-27 DIAGNOSIS — E1165 Type 2 diabetes mellitus with hyperglycemia: Secondary | ICD-10-CM | POA: Diagnosis not present

## 2020-05-27 DIAGNOSIS — Z79899 Other long term (current) drug therapy: Secondary | ICD-10-CM | POA: Diagnosis not present

## 2020-05-27 DIAGNOSIS — Z87891 Personal history of nicotine dependence: Secondary | ICD-10-CM | POA: Diagnosis not present

## 2020-05-27 DIAGNOSIS — I2510 Atherosclerotic heart disease of native coronary artery without angina pectoris: Secondary | ICD-10-CM | POA: Diagnosis not present

## 2020-05-27 DIAGNOSIS — C3411 Malignant neoplasm of upper lobe, right bronchus or lung: Secondary | ICD-10-CM | POA: Diagnosis not present

## 2020-05-27 DIAGNOSIS — E1122 Type 2 diabetes mellitus with diabetic chronic kidney disease: Secondary | ICD-10-CM | POA: Diagnosis not present

## 2020-05-27 DIAGNOSIS — N183 Chronic kidney disease, stage 3 unspecified: Secondary | ICD-10-CM | POA: Diagnosis not present

## 2020-05-27 DIAGNOSIS — Z9049 Acquired absence of other specified parts of digestive tract: Secondary | ICD-10-CM | POA: Diagnosis not present

## 2020-05-27 DIAGNOSIS — Z923 Personal history of irradiation: Secondary | ICD-10-CM | POA: Diagnosis not present

## 2020-05-27 DIAGNOSIS — C3491 Malignant neoplasm of unspecified part of right bronchus or lung: Secondary | ICD-10-CM | POA: Diagnosis not present

## 2020-05-27 DIAGNOSIS — R69 Illness, unspecified: Secondary | ICD-10-CM | POA: Diagnosis not present

## 2020-06-24 DIAGNOSIS — E11621 Type 2 diabetes mellitus with foot ulcer: Secondary | ICD-10-CM | POA: Diagnosis not present

## 2020-06-24 DIAGNOSIS — E1122 Type 2 diabetes mellitus with diabetic chronic kidney disease: Secondary | ICD-10-CM | POA: Diagnosis not present

## 2020-06-24 DIAGNOSIS — E1165 Type 2 diabetes mellitus with hyperglycemia: Secondary | ICD-10-CM | POA: Diagnosis not present

## 2020-06-24 DIAGNOSIS — R600 Localized edema: Secondary | ICD-10-CM | POA: Diagnosis not present

## 2020-06-24 DIAGNOSIS — N183 Chronic kidney disease, stage 3 unspecified: Secondary | ICD-10-CM | POA: Diagnosis not present

## 2020-06-24 DIAGNOSIS — C3412 Malignant neoplasm of upper lobe, left bronchus or lung: Secondary | ICD-10-CM | POA: Diagnosis not present

## 2020-06-24 DIAGNOSIS — C3411 Malignant neoplasm of upper lobe, right bronchus or lung: Secondary | ICD-10-CM | POA: Diagnosis not present

## 2020-06-24 DIAGNOSIS — Z923 Personal history of irradiation: Secondary | ICD-10-CM | POA: Diagnosis not present

## 2020-06-24 DIAGNOSIS — Z902 Acquired absence of lung [part of]: Secondary | ICD-10-CM | POA: Diagnosis not present

## 2020-06-24 DIAGNOSIS — R7989 Other specified abnormal findings of blood chemistry: Secondary | ICD-10-CM | POA: Diagnosis not present

## 2020-06-24 DIAGNOSIS — Z5112 Encounter for antineoplastic immunotherapy: Secondary | ICD-10-CM | POA: Diagnosis not present

## 2020-07-08 DIAGNOSIS — I129 Hypertensive chronic kidney disease with stage 1 through stage 4 chronic kidney disease, or unspecified chronic kidney disease: Secondary | ICD-10-CM | POA: Diagnosis not present

## 2020-07-08 DIAGNOSIS — E782 Mixed hyperlipidemia: Secondary | ICD-10-CM | POA: Diagnosis not present

## 2020-07-08 DIAGNOSIS — N184 Chronic kidney disease, stage 4 (severe): Secondary | ICD-10-CM | POA: Diagnosis not present

## 2020-07-08 DIAGNOSIS — E119 Type 2 diabetes mellitus without complications: Secondary | ICD-10-CM | POA: Diagnosis not present

## 2020-07-08 DIAGNOSIS — I10 Essential (primary) hypertension: Secondary | ICD-10-CM | POA: Diagnosis not present

## 2020-07-08 DIAGNOSIS — Z794 Long term (current) use of insulin: Secondary | ICD-10-CM | POA: Diagnosis not present

## 2020-07-08 DIAGNOSIS — I480 Paroxysmal atrial fibrillation: Secondary | ICD-10-CM | POA: Diagnosis not present

## 2020-07-08 DIAGNOSIS — E1122 Type 2 diabetes mellitus with diabetic chronic kidney disease: Secondary | ICD-10-CM | POA: Diagnosis not present

## 2020-07-12 DIAGNOSIS — I10 Essential (primary) hypertension: Secondary | ICD-10-CM | POA: Diagnosis not present

## 2020-07-12 DIAGNOSIS — E782 Mixed hyperlipidemia: Secondary | ICD-10-CM | POA: Diagnosis not present

## 2020-07-12 DIAGNOSIS — Z794 Long term (current) use of insulin: Secondary | ICD-10-CM | POA: Diagnosis not present

## 2020-07-12 DIAGNOSIS — I129 Hypertensive chronic kidney disease with stage 1 through stage 4 chronic kidney disease, or unspecified chronic kidney disease: Secondary | ICD-10-CM | POA: Diagnosis not present

## 2020-07-12 DIAGNOSIS — I480 Paroxysmal atrial fibrillation: Secondary | ICD-10-CM | POA: Diagnosis not present

## 2020-07-12 DIAGNOSIS — N184 Chronic kidney disease, stage 4 (severe): Secondary | ICD-10-CM | POA: Diagnosis not present

## 2020-07-12 DIAGNOSIS — E119 Type 2 diabetes mellitus without complications: Secondary | ICD-10-CM | POA: Diagnosis not present

## 2020-07-12 DIAGNOSIS — E1122 Type 2 diabetes mellitus with diabetic chronic kidney disease: Secondary | ICD-10-CM | POA: Diagnosis not present

## 2020-07-22 DIAGNOSIS — N183 Chronic kidney disease, stage 3 unspecified: Secondary | ICD-10-CM | POA: Diagnosis not present

## 2020-07-22 DIAGNOSIS — M79641 Pain in right hand: Secondary | ICD-10-CM | POA: Diagnosis not present

## 2020-07-22 DIAGNOSIS — M79642 Pain in left hand: Secondary | ICD-10-CM | POA: Diagnosis not present

## 2020-07-22 DIAGNOSIS — C3411 Malignant neoplasm of upper lobe, right bronchus or lung: Secondary | ICD-10-CM | POA: Diagnosis not present

## 2020-07-22 DIAGNOSIS — R590 Localized enlarged lymph nodes: Secondary | ICD-10-CM | POA: Diagnosis not present

## 2020-07-22 DIAGNOSIS — E11621 Type 2 diabetes mellitus with foot ulcer: Secondary | ICD-10-CM | POA: Diagnosis not present

## 2020-07-22 DIAGNOSIS — C3412 Malignant neoplasm of upper lobe, left bronchus or lung: Secondary | ICD-10-CM | POA: Diagnosis not present

## 2020-07-22 DIAGNOSIS — R21 Rash and other nonspecific skin eruption: Secondary | ICD-10-CM | POA: Diagnosis not present

## 2020-07-22 DIAGNOSIS — Z5112 Encounter for antineoplastic immunotherapy: Secondary | ICD-10-CM | POA: Diagnosis not present

## 2020-07-22 DIAGNOSIS — E1122 Type 2 diabetes mellitus with diabetic chronic kidney disease: Secondary | ICD-10-CM | POA: Diagnosis not present

## 2020-08-09 DIAGNOSIS — Z23 Encounter for immunization: Secondary | ICD-10-CM | POA: Diagnosis not present

## 2020-08-19 DIAGNOSIS — C3412 Malignant neoplasm of upper lobe, left bronchus or lung: Secondary | ICD-10-CM | POA: Diagnosis not present

## 2020-08-19 DIAGNOSIS — R053 Chronic cough: Secondary | ICD-10-CM | POA: Diagnosis not present

## 2020-08-19 DIAGNOSIS — N183 Chronic kidney disease, stage 3 unspecified: Secondary | ICD-10-CM | POA: Diagnosis not present

## 2020-08-19 DIAGNOSIS — M79641 Pain in right hand: Secondary | ICD-10-CM | POA: Diagnosis not present

## 2020-08-19 DIAGNOSIS — E11621 Type 2 diabetes mellitus with foot ulcer: Secondary | ICD-10-CM | POA: Diagnosis not present

## 2020-08-19 DIAGNOSIS — R590 Localized enlarged lymph nodes: Secondary | ICD-10-CM | POA: Diagnosis not present

## 2020-08-19 DIAGNOSIS — E1122 Type 2 diabetes mellitus with diabetic chronic kidney disease: Secondary | ICD-10-CM | POA: Diagnosis not present

## 2020-08-19 DIAGNOSIS — R7989 Other specified abnormal findings of blood chemistry: Secondary | ICD-10-CM | POA: Diagnosis not present

## 2020-08-19 DIAGNOSIS — R918 Other nonspecific abnormal finding of lung field: Secondary | ICD-10-CM | POA: Diagnosis not present

## 2020-08-19 DIAGNOSIS — C3411 Malignant neoplasm of upper lobe, right bronchus or lung: Secondary | ICD-10-CM | POA: Diagnosis not present

## 2020-08-19 DIAGNOSIS — Z5112 Encounter for antineoplastic immunotherapy: Secondary | ICD-10-CM | POA: Diagnosis not present

## 2020-08-19 DIAGNOSIS — R21 Rash and other nonspecific skin eruption: Secondary | ICD-10-CM | POA: Diagnosis not present

## 2020-08-19 DIAGNOSIS — Z85118 Personal history of other malignant neoplasm of bronchus and lung: Secondary | ICD-10-CM | POA: Diagnosis not present

## 2020-10-07 DIAGNOSIS — Z5112 Encounter for antineoplastic immunotherapy: Secondary | ICD-10-CM | POA: Diagnosis not present

## 2020-10-07 DIAGNOSIS — Z923 Personal history of irradiation: Secondary | ICD-10-CM | POA: Diagnosis not present

## 2020-10-07 DIAGNOSIS — C3411 Malignant neoplasm of upper lobe, right bronchus or lung: Secondary | ICD-10-CM | POA: Diagnosis not present

## 2020-10-07 DIAGNOSIS — Z79899 Other long term (current) drug therapy: Secondary | ICD-10-CM | POA: Diagnosis not present

## 2020-10-07 DIAGNOSIS — R69 Illness, unspecified: Secondary | ICD-10-CM | POA: Diagnosis not present

## 2020-10-07 DIAGNOSIS — C3412 Malignant neoplasm of upper lobe, left bronchus or lung: Secondary | ICD-10-CM | POA: Diagnosis not present

## 2020-10-28 DIAGNOSIS — N1831 Chronic kidney disease, stage 3a: Secondary | ICD-10-CM | POA: Diagnosis not present

## 2020-10-28 DIAGNOSIS — I10 Essential (primary) hypertension: Secondary | ICD-10-CM | POA: Diagnosis not present

## 2020-10-28 DIAGNOSIS — N1832 Chronic kidney disease, stage 3b: Secondary | ICD-10-CM | POA: Diagnosis not present

## 2020-10-28 DIAGNOSIS — E1165 Type 2 diabetes mellitus with hyperglycemia: Secondary | ICD-10-CM | POA: Diagnosis not present

## 2020-11-01 DIAGNOSIS — R0602 Shortness of breath: Secondary | ICD-10-CM | POA: Diagnosis not present

## 2020-11-01 DIAGNOSIS — R509 Fever, unspecified: Secondary | ICD-10-CM | POA: Diagnosis not present

## 2020-11-01 DIAGNOSIS — R918 Other nonspecific abnormal finding of lung field: Secondary | ICD-10-CM | POA: Diagnosis not present

## 2020-11-01 DIAGNOSIS — Z902 Acquired absence of lung [part of]: Secondary | ICD-10-CM | POA: Diagnosis not present

## 2020-11-02 DIAGNOSIS — J90 Pleural effusion, not elsewhere classified: Secondary | ICD-10-CM | POA: Diagnosis not present

## 2020-11-02 DIAGNOSIS — R0602 Shortness of breath: Secondary | ICD-10-CM | POA: Diagnosis not present

## 2020-11-02 DIAGNOSIS — R0609 Other forms of dyspnea: Secondary | ICD-10-CM | POA: Diagnosis not present

## 2020-11-02 DIAGNOSIS — R509 Fever, unspecified: Secondary | ICD-10-CM | POA: Diagnosis not present

## 2020-11-02 DIAGNOSIS — I4891 Unspecified atrial fibrillation: Secondary | ICD-10-CM | POA: Diagnosis not present

## 2020-11-02 DIAGNOSIS — J449 Chronic obstructive pulmonary disease, unspecified: Secondary | ICD-10-CM | POA: Diagnosis not present

## 2020-11-02 DIAGNOSIS — J9601 Acute respiratory failure with hypoxia: Secondary | ICD-10-CM | POA: Diagnosis not present

## 2020-11-02 DIAGNOSIS — J849 Interstitial pulmonary disease, unspecified: Secondary | ICD-10-CM | POA: Diagnosis not present

## 2020-11-02 DIAGNOSIS — N179 Acute kidney failure, unspecified: Secondary | ICD-10-CM | POA: Diagnosis not present

## 2020-11-02 DIAGNOSIS — Z66 Do not resuscitate: Secondary | ICD-10-CM | POA: Diagnosis not present

## 2020-11-02 DIAGNOSIS — I50810 Right heart failure, unspecified: Secondary | ICD-10-CM | POA: Diagnosis not present

## 2020-11-02 DIAGNOSIS — N183 Chronic kidney disease, stage 3 unspecified: Secondary | ICD-10-CM | POA: Diagnosis not present

## 2020-11-02 DIAGNOSIS — N1832 Chronic kidney disease, stage 3b: Secondary | ICD-10-CM | POA: Diagnosis not present

## 2020-11-02 DIAGNOSIS — Z902 Acquired absence of lung [part of]: Secondary | ICD-10-CM | POA: Diagnosis not present

## 2020-11-02 DIAGNOSIS — J441 Chronic obstructive pulmonary disease with (acute) exacerbation: Secondary | ICD-10-CM | POA: Diagnosis not present

## 2020-11-02 DIAGNOSIS — R000 Tachycardia, unspecified: Secondary | ICD-10-CM | POA: Diagnosis not present

## 2020-11-02 DIAGNOSIS — Z20822 Contact with and (suspected) exposure to covid-19: Secondary | ICD-10-CM | POA: Diagnosis not present

## 2020-11-02 DIAGNOSIS — R918 Other nonspecific abnormal finding of lung field: Secondary | ICD-10-CM | POA: Diagnosis not present

## 2020-11-02 DIAGNOSIS — Z86711 Personal history of pulmonary embolism: Secondary | ICD-10-CM | POA: Diagnosis not present

## 2020-11-02 DIAGNOSIS — I130 Hypertensive heart and chronic kidney disease with heart failure and stage 1 through stage 4 chronic kidney disease, or unspecified chronic kidney disease: Secondary | ICD-10-CM | POA: Diagnosis not present

## 2020-11-02 DIAGNOSIS — R6520 Severe sepsis without septic shock: Secondary | ICD-10-CM | POA: Diagnosis not present

## 2020-11-02 DIAGNOSIS — A419 Sepsis, unspecified organism: Secondary | ICD-10-CM | POA: Diagnosis not present

## 2020-11-11 DIAGNOSIS — Z09 Encounter for follow-up examination after completed treatment for conditions other than malignant neoplasm: Secondary | ICD-10-CM | POA: Diagnosis not present

## 2020-11-11 DIAGNOSIS — E1122 Type 2 diabetes mellitus with diabetic chronic kidney disease: Secondary | ICD-10-CM | POA: Diagnosis not present

## 2020-11-11 DIAGNOSIS — Z6836 Body mass index (BMI) 36.0-36.9, adult: Secondary | ICD-10-CM | POA: Diagnosis not present

## 2020-11-11 DIAGNOSIS — R7401 Elevation of levels of liver transaminase levels: Secondary | ICD-10-CM | POA: Diagnosis not present

## 2020-11-11 DIAGNOSIS — Z794 Long term (current) use of insulin: Secondary | ICD-10-CM | POA: Diagnosis not present

## 2020-11-11 DIAGNOSIS — N183 Chronic kidney disease, stage 3 unspecified: Secondary | ICD-10-CM | POA: Diagnosis not present

## 2020-11-11 DIAGNOSIS — I129 Hypertensive chronic kidney disease with stage 1 through stage 4 chronic kidney disease, or unspecified chronic kidney disease: Secondary | ICD-10-CM | POA: Diagnosis not present

## 2020-11-11 DIAGNOSIS — E782 Mixed hyperlipidemia: Secondary | ICD-10-CM | POA: Diagnosis not present

## 2020-11-11 DIAGNOSIS — I2721 Secondary pulmonary arterial hypertension: Secondary | ICD-10-CM | POA: Diagnosis not present

## 2020-11-17 DIAGNOSIS — N183 Chronic kidney disease, stage 3 unspecified: Secondary | ICD-10-CM | POA: Diagnosis not present

## 2020-11-17 DIAGNOSIS — R7401 Elevation of levels of liver transaminase levels: Secondary | ICD-10-CM | POA: Diagnosis not present

## 2020-11-22 DIAGNOSIS — C3412 Malignant neoplasm of upper lobe, left bronchus or lung: Secondary | ICD-10-CM | POA: Diagnosis not present

## 2020-12-03 DIAGNOSIS — C3411 Malignant neoplasm of upper lobe, right bronchus or lung: Secondary | ICD-10-CM | POA: Diagnosis not present

## 2020-12-03 DIAGNOSIS — C3412 Malignant neoplasm of upper lobe, left bronchus or lung: Secondary | ICD-10-CM | POA: Diagnosis not present

## 2020-12-03 DIAGNOSIS — Z902 Acquired absence of lung [part of]: Secondary | ICD-10-CM | POA: Diagnosis not present

## 2020-12-03 DIAGNOSIS — C3491 Malignant neoplasm of unspecified part of right bronchus or lung: Secondary | ICD-10-CM | POA: Diagnosis not present

## 2020-12-03 DIAGNOSIS — J449 Chronic obstructive pulmonary disease, unspecified: Secondary | ICD-10-CM | POA: Diagnosis not present

## 2020-12-03 DIAGNOSIS — Z8701 Personal history of pneumonia (recurrent): Secondary | ICD-10-CM | POA: Diagnosis not present

## 2020-12-03 DIAGNOSIS — C779 Secondary and unspecified malignant neoplasm of lymph node, unspecified: Secondary | ICD-10-CM | POA: Diagnosis not present

## 2020-12-03 DIAGNOSIS — E119 Type 2 diabetes mellitus without complications: Secondary | ICD-10-CM | POA: Diagnosis not present

## 2020-12-03 DIAGNOSIS — L309 Dermatitis, unspecified: Secondary | ICD-10-CM | POA: Diagnosis not present

## 2020-12-03 DIAGNOSIS — R69 Illness, unspecified: Secondary | ICD-10-CM | POA: Diagnosis not present

## 2020-12-03 DIAGNOSIS — Z5112 Encounter for antineoplastic immunotherapy: Secondary | ICD-10-CM | POA: Diagnosis not present

## 2020-12-05 DIAGNOSIS — I4891 Unspecified atrial fibrillation: Secondary | ICD-10-CM | POA: Diagnosis not present

## 2020-12-05 DIAGNOSIS — J449 Chronic obstructive pulmonary disease, unspecified: Secondary | ICD-10-CM | POA: Diagnosis not present

## 2020-12-06 DIAGNOSIS — D6869 Other thrombophilia: Secondary | ICD-10-CM | POA: Diagnosis not present

## 2020-12-06 DIAGNOSIS — E1165 Type 2 diabetes mellitus with hyperglycemia: Secondary | ICD-10-CM | POA: Diagnosis not present

## 2020-12-06 DIAGNOSIS — I4891 Unspecified atrial fibrillation: Secondary | ICD-10-CM | POA: Diagnosis not present

## 2020-12-06 DIAGNOSIS — E1122 Type 2 diabetes mellitus with diabetic chronic kidney disease: Secondary | ICD-10-CM | POA: Diagnosis not present

## 2020-12-06 DIAGNOSIS — J449 Chronic obstructive pulmonary disease, unspecified: Secondary | ICD-10-CM | POA: Diagnosis not present

## 2020-12-06 DIAGNOSIS — E669 Obesity, unspecified: Secondary | ICD-10-CM | POA: Diagnosis not present

## 2020-12-06 DIAGNOSIS — J9610 Chronic respiratory failure, unspecified whether with hypoxia or hypercapnia: Secondary | ICD-10-CM | POA: Diagnosis not present

## 2020-12-06 DIAGNOSIS — Z794 Long term (current) use of insulin: Secondary | ICD-10-CM | POA: Diagnosis not present

## 2020-12-06 DIAGNOSIS — E785 Hyperlipidemia, unspecified: Secondary | ICD-10-CM | POA: Diagnosis not present

## 2020-12-06 DIAGNOSIS — R69 Illness, unspecified: Secondary | ICD-10-CM | POA: Diagnosis not present

## 2021-01-02 DIAGNOSIS — I4891 Unspecified atrial fibrillation: Secondary | ICD-10-CM | POA: Diagnosis not present

## 2021-01-02 DIAGNOSIS — J449 Chronic obstructive pulmonary disease, unspecified: Secondary | ICD-10-CM | POA: Diagnosis not present

## 2021-02-02 DIAGNOSIS — J449 Chronic obstructive pulmonary disease, unspecified: Secondary | ICD-10-CM | POA: Diagnosis not present

## 2021-02-02 DIAGNOSIS — I4891 Unspecified atrial fibrillation: Secondary | ICD-10-CM | POA: Diagnosis not present

## 2021-02-21 DIAGNOSIS — J90 Pleural effusion, not elsewhere classified: Secondary | ICD-10-CM | POA: Diagnosis not present

## 2021-02-21 DIAGNOSIS — Z85118 Personal history of other malignant neoplasm of bronchus and lung: Secondary | ICD-10-CM | POA: Diagnosis not present

## 2021-02-21 DIAGNOSIS — C3412 Malignant neoplasm of upper lobe, left bronchus or lung: Secondary | ICD-10-CM | POA: Diagnosis not present

## 2021-02-21 DIAGNOSIS — Z902 Acquired absence of lung [part of]: Secondary | ICD-10-CM | POA: Diagnosis not present

## 2021-02-21 DIAGNOSIS — Z08 Encounter for follow-up examination after completed treatment for malignant neoplasm: Secondary | ICD-10-CM | POA: Diagnosis not present

## 2021-02-21 DIAGNOSIS — J811 Chronic pulmonary edema: Secondary | ICD-10-CM | POA: Diagnosis not present

## 2021-02-21 DIAGNOSIS — R69 Illness, unspecified: Secondary | ICD-10-CM | POA: Diagnosis not present

## 2021-03-02 DIAGNOSIS — N184 Chronic kidney disease, stage 4 (severe): Secondary | ICD-10-CM | POA: Diagnosis not present

## 2021-03-02 DIAGNOSIS — J449 Chronic obstructive pulmonary disease, unspecified: Secondary | ICD-10-CM | POA: Diagnosis not present

## 2021-03-02 DIAGNOSIS — E1122 Type 2 diabetes mellitus with diabetic chronic kidney disease: Secondary | ICD-10-CM | POA: Diagnosis not present

## 2021-03-02 DIAGNOSIS — N183 Chronic kidney disease, stage 3 unspecified: Secondary | ICD-10-CM | POA: Diagnosis not present

## 2021-03-02 DIAGNOSIS — E782 Mixed hyperlipidemia: Secondary | ICD-10-CM | POA: Diagnosis not present

## 2021-03-02 DIAGNOSIS — I129 Hypertensive chronic kidney disease with stage 1 through stage 4 chronic kidney disease, or unspecified chronic kidney disease: Secondary | ICD-10-CM | POA: Diagnosis not present

## 2021-03-02 DIAGNOSIS — I10 Essential (primary) hypertension: Secondary | ICD-10-CM | POA: Diagnosis not present

## 2021-03-02 DIAGNOSIS — Z794 Long term (current) use of insulin: Secondary | ICD-10-CM | POA: Diagnosis not present

## 2021-03-02 DIAGNOSIS — J42 Unspecified chronic bronchitis: Secondary | ICD-10-CM | POA: Diagnosis not present

## 2021-03-02 DIAGNOSIS — Z9981 Dependence on supplemental oxygen: Secondary | ICD-10-CM | POA: Diagnosis not present

## 2021-03-04 DIAGNOSIS — J449 Chronic obstructive pulmonary disease, unspecified: Secondary | ICD-10-CM | POA: Diagnosis not present

## 2021-03-04 DIAGNOSIS — I4891 Unspecified atrial fibrillation: Secondary | ICD-10-CM | POA: Diagnosis not present

## 2021-04-04 DIAGNOSIS — J449 Chronic obstructive pulmonary disease, unspecified: Secondary | ICD-10-CM | POA: Diagnosis not present

## 2021-04-04 DIAGNOSIS — I4891 Unspecified atrial fibrillation: Secondary | ICD-10-CM | POA: Diagnosis not present

## 2021-04-19 ENCOUNTER — Other Ambulatory Visit: Payer: Self-pay | Admitting: *Deleted

## 2021-04-19 NOTE — Patient Outreach (Signed)
[place] [facility]) Care Management  04/19/2021  Shayne Diguglielmo [birth year] [id number]  Telephone outreach for [place] Referral.  Talked with Mr. Pertuit today, advised of purpose of call to introduce [place] Management services. Mr. Alvizo would like to receive written material about our services before giving consent. Will send successful outreach letter.  Mr. Fralix asked if he could get a smaller portable O2 delivery system. Advised I would be glad to request this from Dr. Kym Groom.  We agreed we would talk again in 2 weeks.  Called Dr. Devona Konig office, talked to Debroah Baller, who took the message to ask Dr. Kym Groom about ordering the smaller portable O2 device.  [name] [name], MSN, [facility] Gerontological Nurse Practitioner [facility] Care Management [phone]

## 2021-05-04 DIAGNOSIS — I4891 Unspecified atrial fibrillation: Secondary | ICD-10-CM | POA: Diagnosis not present

## 2021-05-04 DIAGNOSIS — J449 Chronic obstructive pulmonary disease, unspecified: Secondary | ICD-10-CM | POA: Diagnosis not present

## 2021-05-12 DIAGNOSIS — I10 Essential (primary) hypertension: Secondary | ICD-10-CM | POA: Diagnosis not present

## 2021-05-12 DIAGNOSIS — N1832 Chronic kidney disease, stage 3b: Secondary | ICD-10-CM | POA: Diagnosis not present

## 2021-05-12 DIAGNOSIS — N184 Chronic kidney disease, stage 4 (severe): Secondary | ICD-10-CM | POA: Diagnosis not present

## 2021-05-19 ENCOUNTER — Other Ambulatory Visit: Payer: Self-pay | Admitting: *Deleted

## 2021-05-19 NOTE — Patient Outreach (Signed)
[place] [facility]) Care Management  05/19/2021  Matthew Villarreal [birth year] [id number]  Telephone follow up after having a successful outreach and sending out informational letter regarding [facility] care management servicis. No answer and no mailbox. Additional phone number called and this line is not accepting calls. Will close this referral, unable to contact.  [name] [name], MSN, [facility] Gerontological Nurse Practitioner [facility] Care Management [phone]

## 2021-05-26 DIAGNOSIS — Z923 Personal history of irradiation: Secondary | ICD-10-CM | POA: Diagnosis not present

## 2021-05-26 DIAGNOSIS — J441 Chronic obstructive pulmonary disease with (acute) exacerbation: Secondary | ICD-10-CM | POA: Diagnosis not present

## 2021-05-26 DIAGNOSIS — Z930 Tracheostomy status: Secondary | ICD-10-CM | POA: Diagnosis not present

## 2021-05-26 DIAGNOSIS — R69 Illness, unspecified: Secondary | ICD-10-CM | POA: Diagnosis not present

## 2021-05-26 DIAGNOSIS — J90 Pleural effusion, not elsewhere classified: Secondary | ICD-10-CM | POA: Diagnosis not present

## 2021-05-26 DIAGNOSIS — J811 Chronic pulmonary edema: Secondary | ICD-10-CM | POA: Diagnosis not present

## 2021-05-26 DIAGNOSIS — Z9221 Personal history of antineoplastic chemotherapy: Secondary | ICD-10-CM | POA: Diagnosis not present

## 2021-05-26 DIAGNOSIS — C3491 Malignant neoplasm of unspecified part of right bronchus or lung: Secondary | ICD-10-CM | POA: Diagnosis not present

## 2021-05-26 DIAGNOSIS — J9811 Atelectasis: Secondary | ICD-10-CM | POA: Diagnosis not present

## 2021-06-04 DIAGNOSIS — I4891 Unspecified atrial fibrillation: Secondary | ICD-10-CM | POA: Diagnosis not present

## 2021-06-04 DIAGNOSIS — J449 Chronic obstructive pulmonary disease, unspecified: Secondary | ICD-10-CM | POA: Diagnosis not present

## 2021-07-05 DIAGNOSIS — I4891 Unspecified atrial fibrillation: Secondary | ICD-10-CM | POA: Diagnosis not present

## 2021-07-05 DIAGNOSIS — J449 Chronic obstructive pulmonary disease, unspecified: Secondary | ICD-10-CM | POA: Diagnosis not present

## 2021-08-04 DIAGNOSIS — J449 Chronic obstructive pulmonary disease, unspecified: Secondary | ICD-10-CM | POA: Diagnosis not present

## 2021-08-04 DIAGNOSIS — I4891 Unspecified atrial fibrillation: Secondary | ICD-10-CM | POA: Diagnosis not present

## 2021-08-24 DIAGNOSIS — J309 Allergic rhinitis, unspecified: Secondary | ICD-10-CM | POA: Diagnosis not present

## 2021-08-24 DIAGNOSIS — R531 Weakness: Secondary | ICD-10-CM | POA: Diagnosis not present

## 2021-08-24 DIAGNOSIS — J42 Unspecified chronic bronchitis: Secondary | ICD-10-CM | POA: Diagnosis not present

## 2021-08-24 DIAGNOSIS — Z23 Encounter for immunization: Secondary | ICD-10-CM | POA: Diagnosis not present

## 2021-08-24 DIAGNOSIS — E782 Mixed hyperlipidemia: Secondary | ICD-10-CM | POA: Diagnosis not present

## 2021-08-24 DIAGNOSIS — R0989 Other specified symptoms and signs involving the circulatory and respiratory systems: Secondary | ICD-10-CM | POA: Diagnosis not present

## 2021-08-24 DIAGNOSIS — E119 Type 2 diabetes mellitus without complications: Secondary | ICD-10-CM | POA: Diagnosis not present

## 2021-08-24 DIAGNOSIS — C3490 Malignant neoplasm of unspecified part of unspecified bronchus or lung: Secondary | ICD-10-CM | POA: Diagnosis not present

## 2021-08-24 DIAGNOSIS — I10 Essential (primary) hypertension: Secondary | ICD-10-CM | POA: Diagnosis not present

## 2021-08-24 DIAGNOSIS — Z794 Long term (current) use of insulin: Secondary | ICD-10-CM | POA: Diagnosis not present

## 2021-08-24 DIAGNOSIS — J449 Chronic obstructive pulmonary disease, unspecified: Secondary | ICD-10-CM | POA: Diagnosis not present

## 2021-08-25 DIAGNOSIS — Z85118 Personal history of other malignant neoplasm of bronchus and lung: Secondary | ICD-10-CM | POA: Diagnosis not present

## 2021-08-25 DIAGNOSIS — Z08 Encounter for follow-up examination after completed treatment for malignant neoplasm: Secondary | ICD-10-CM | POA: Diagnosis not present

## 2021-08-25 DIAGNOSIS — J90 Pleural effusion, not elsewhere classified: Secondary | ICD-10-CM | POA: Diagnosis not present

## 2021-08-25 DIAGNOSIS — J811 Chronic pulmonary edema: Secondary | ICD-10-CM | POA: Diagnosis not present

## 2021-08-25 DIAGNOSIS — C3491 Malignant neoplasm of unspecified part of right bronchus or lung: Secondary | ICD-10-CM | POA: Diagnosis not present

## 2021-08-25 DIAGNOSIS — C3490 Malignant neoplasm of unspecified part of unspecified bronchus or lung: Secondary | ICD-10-CM | POA: Diagnosis not present

## 2021-08-25 DIAGNOSIS — Z923 Personal history of irradiation: Secondary | ICD-10-CM | POA: Diagnosis not present

## 2021-08-25 DIAGNOSIS — Z79899 Other long term (current) drug therapy: Secondary | ICD-10-CM | POA: Diagnosis not present

## 2021-08-25 DIAGNOSIS — R69 Illness, unspecified: Secondary | ICD-10-CM | POA: Diagnosis not present

## 2021-08-25 DIAGNOSIS — R590 Localized enlarged lymph nodes: Secondary | ICD-10-CM | POA: Diagnosis not present

## 2021-09-04 DIAGNOSIS — I4891 Unspecified atrial fibrillation: Secondary | ICD-10-CM | POA: Diagnosis not present

## 2021-09-04 DIAGNOSIS — J449 Chronic obstructive pulmonary disease, unspecified: Secondary | ICD-10-CM | POA: Diagnosis not present

## 2021-09-14 DIAGNOSIS — R918 Other nonspecific abnormal finding of lung field: Secondary | ICD-10-CM | POA: Diagnosis not present

## 2021-09-14 DIAGNOSIS — C3412 Malignant neoplasm of upper lobe, left bronchus or lung: Secondary | ICD-10-CM | POA: Diagnosis not present

## 2021-09-14 DIAGNOSIS — R531 Weakness: Secondary | ICD-10-CM | POA: Diagnosis not present

## 2021-09-14 DIAGNOSIS — J449 Chronic obstructive pulmonary disease, unspecified: Secondary | ICD-10-CM | POA: Diagnosis not present

## 2021-09-14 DIAGNOSIS — R053 Chronic cough: Secondary | ICD-10-CM | POA: Diagnosis not present

## 2021-09-14 DIAGNOSIS — R609 Edema, unspecified: Secondary | ICD-10-CM | POA: Diagnosis not present

## 2021-09-14 DIAGNOSIS — R69 Illness, unspecified: Secondary | ICD-10-CM | POA: Diagnosis not present

## 2021-09-14 DIAGNOSIS — R590 Localized enlarged lymph nodes: Secondary | ICD-10-CM | POA: Diagnosis not present

## 2021-09-14 DIAGNOSIS — C3411 Malignant neoplasm of upper lobe, right bronchus or lung: Secondary | ICD-10-CM | POA: Diagnosis not present

## 2021-09-14 DIAGNOSIS — Z5112 Encounter for antineoplastic immunotherapy: Secondary | ICD-10-CM | POA: Diagnosis not present

## 2021-09-19 DIAGNOSIS — R0602 Shortness of breath: Secondary | ICD-10-CM | POA: Diagnosis not present

## 2021-09-19 DIAGNOSIS — I5033 Acute on chronic diastolic (congestive) heart failure: Secondary | ICD-10-CM | POA: Diagnosis not present

## 2021-09-19 DIAGNOSIS — J431 Panlobular emphysema: Secondary | ICD-10-CM | POA: Diagnosis not present

## 2021-09-21 DIAGNOSIS — E119 Type 2 diabetes mellitus without complications: Secondary | ICD-10-CM | POA: Diagnosis not present

## 2021-09-21 DIAGNOSIS — M1990 Unspecified osteoarthritis, unspecified site: Secondary | ICD-10-CM | POA: Diagnosis not present

## 2021-09-21 DIAGNOSIS — F17210 Nicotine dependence, cigarettes, uncomplicated: Secondary | ICD-10-CM | POA: Diagnosis not present

## 2021-09-21 DIAGNOSIS — R69 Illness, unspecified: Secondary | ICD-10-CM | POA: Diagnosis not present

## 2021-09-21 DIAGNOSIS — G4730 Sleep apnea, unspecified: Secondary | ICD-10-CM | POA: Diagnosis not present

## 2021-09-21 DIAGNOSIS — E785 Hyperlipidemia, unspecified: Secondary | ICD-10-CM | POA: Diagnosis not present

## 2021-09-21 DIAGNOSIS — F419 Anxiety disorder, unspecified: Secondary | ICD-10-CM | POA: Diagnosis not present

## 2021-09-21 DIAGNOSIS — Z86711 Personal history of pulmonary embolism: Secondary | ICD-10-CM | POA: Diagnosis not present

## 2021-09-21 DIAGNOSIS — C3411 Malignant neoplasm of upper lobe, right bronchus or lung: Secondary | ICD-10-CM | POA: Diagnosis not present

## 2021-09-21 DIAGNOSIS — Z89021 Acquired absence of right finger(s): Secondary | ICD-10-CM | POA: Diagnosis not present

## 2021-09-21 DIAGNOSIS — J449 Chronic obstructive pulmonary disease, unspecified: Secondary | ICD-10-CM | POA: Diagnosis not present

## 2021-09-21 DIAGNOSIS — Z794 Long term (current) use of insulin: Secondary | ICD-10-CM | POA: Diagnosis not present

## 2021-09-21 DIAGNOSIS — I10 Essential (primary) hypertension: Secondary | ICD-10-CM | POA: Diagnosis not present

## 2021-09-21 DIAGNOSIS — Z7951 Long term (current) use of inhaled steroids: Secondary | ICD-10-CM | POA: Diagnosis not present

## 2021-09-21 DIAGNOSIS — I4891 Unspecified atrial fibrillation: Secondary | ICD-10-CM | POA: Diagnosis not present

## 2021-09-21 DIAGNOSIS — Z7984 Long term (current) use of oral hypoglycemic drugs: Secondary | ICD-10-CM | POA: Diagnosis not present

## 2021-09-21 DIAGNOSIS — Z7901 Long term (current) use of anticoagulants: Secondary | ICD-10-CM | POA: Diagnosis not present

## 2021-09-21 DIAGNOSIS — C3412 Malignant neoplasm of upper lobe, left bronchus or lung: Secondary | ICD-10-CM | POA: Diagnosis not present

## 2021-09-21 DIAGNOSIS — N529 Male erectile dysfunction, unspecified: Secondary | ICD-10-CM | POA: Diagnosis not present

## 2021-09-28 DIAGNOSIS — E785 Hyperlipidemia, unspecified: Secondary | ICD-10-CM | POA: Diagnosis not present

## 2021-09-28 DIAGNOSIS — M1990 Unspecified osteoarthritis, unspecified site: Secondary | ICD-10-CM | POA: Diagnosis not present

## 2021-09-28 DIAGNOSIS — F17210 Nicotine dependence, cigarettes, uncomplicated: Secondary | ICD-10-CM | POA: Diagnosis not present

## 2021-09-28 DIAGNOSIS — G4730 Sleep apnea, unspecified: Secondary | ICD-10-CM | POA: Diagnosis not present

## 2021-09-28 DIAGNOSIS — Z794 Long term (current) use of insulin: Secondary | ICD-10-CM | POA: Diagnosis not present

## 2021-09-28 DIAGNOSIS — Z89021 Acquired absence of right finger(s): Secondary | ICD-10-CM | POA: Diagnosis not present

## 2021-09-28 DIAGNOSIS — F419 Anxiety disorder, unspecified: Secondary | ICD-10-CM | POA: Diagnosis not present

## 2021-09-28 DIAGNOSIS — C3411 Malignant neoplasm of upper lobe, right bronchus or lung: Secondary | ICD-10-CM | POA: Diagnosis not present

## 2021-09-28 DIAGNOSIS — Z86711 Personal history of pulmonary embolism: Secondary | ICD-10-CM | POA: Diagnosis not present

## 2021-09-28 DIAGNOSIS — I10 Essential (primary) hypertension: Secondary | ICD-10-CM | POA: Diagnosis not present

## 2021-09-28 DIAGNOSIS — Z7901 Long term (current) use of anticoagulants: Secondary | ICD-10-CM | POA: Diagnosis not present

## 2021-09-28 DIAGNOSIS — C3412 Malignant neoplasm of upper lobe, left bronchus or lung: Secondary | ICD-10-CM | POA: Diagnosis not present

## 2021-09-28 DIAGNOSIS — R69 Illness, unspecified: Secondary | ICD-10-CM | POA: Diagnosis not present

## 2021-09-28 DIAGNOSIS — J449 Chronic obstructive pulmonary disease, unspecified: Secondary | ICD-10-CM | POA: Diagnosis not present

## 2021-09-28 DIAGNOSIS — N529 Male erectile dysfunction, unspecified: Secondary | ICD-10-CM | POA: Diagnosis not present

## 2021-09-28 DIAGNOSIS — I4891 Unspecified atrial fibrillation: Secondary | ICD-10-CM | POA: Diagnosis not present

## 2021-09-28 DIAGNOSIS — Z7984 Long term (current) use of oral hypoglycemic drugs: Secondary | ICD-10-CM | POA: Diagnosis not present

## 2021-09-28 DIAGNOSIS — Z7951 Long term (current) use of inhaled steroids: Secondary | ICD-10-CM | POA: Diagnosis not present

## 2021-09-28 DIAGNOSIS — E119 Type 2 diabetes mellitus without complications: Secondary | ICD-10-CM | POA: Diagnosis not present

## 2021-10-04 DIAGNOSIS — C3411 Malignant neoplasm of upper lobe, right bronchus or lung: Secondary | ICD-10-CM | POA: Diagnosis not present

## 2021-10-04 DIAGNOSIS — C3412 Malignant neoplasm of upper lobe, left bronchus or lung: Secondary | ICD-10-CM | POA: Diagnosis not present

## 2021-10-04 DIAGNOSIS — Z89021 Acquired absence of right finger(s): Secondary | ICD-10-CM | POA: Diagnosis not present

## 2021-10-04 DIAGNOSIS — F17210 Nicotine dependence, cigarettes, uncomplicated: Secondary | ICD-10-CM | POA: Diagnosis not present

## 2021-10-04 DIAGNOSIS — Z794 Long term (current) use of insulin: Secondary | ICD-10-CM | POA: Diagnosis not present

## 2021-10-04 DIAGNOSIS — Z86711 Personal history of pulmonary embolism: Secondary | ICD-10-CM | POA: Diagnosis not present

## 2021-10-04 DIAGNOSIS — Z7951 Long term (current) use of inhaled steroids: Secondary | ICD-10-CM | POA: Diagnosis not present

## 2021-10-04 DIAGNOSIS — N529 Male erectile dysfunction, unspecified: Secondary | ICD-10-CM | POA: Diagnosis not present

## 2021-10-04 DIAGNOSIS — Z7984 Long term (current) use of oral hypoglycemic drugs: Secondary | ICD-10-CM | POA: Diagnosis not present

## 2021-10-04 DIAGNOSIS — E119 Type 2 diabetes mellitus without complications: Secondary | ICD-10-CM | POA: Diagnosis not present

## 2021-10-04 DIAGNOSIS — M1990 Unspecified osteoarthritis, unspecified site: Secondary | ICD-10-CM | POA: Diagnosis not present

## 2021-10-04 DIAGNOSIS — E785 Hyperlipidemia, unspecified: Secondary | ICD-10-CM | POA: Diagnosis not present

## 2021-10-04 DIAGNOSIS — Z7901 Long term (current) use of anticoagulants: Secondary | ICD-10-CM | POA: Diagnosis not present

## 2021-10-04 DIAGNOSIS — I4891 Unspecified atrial fibrillation: Secondary | ICD-10-CM | POA: Diagnosis not present

## 2021-10-04 DIAGNOSIS — J449 Chronic obstructive pulmonary disease, unspecified: Secondary | ICD-10-CM | POA: Diagnosis not present

## 2021-10-04 DIAGNOSIS — F419 Anxiety disorder, unspecified: Secondary | ICD-10-CM | POA: Diagnosis not present

## 2021-10-04 DIAGNOSIS — I10 Essential (primary) hypertension: Secondary | ICD-10-CM | POA: Diagnosis not present

## 2021-10-04 DIAGNOSIS — R69 Illness, unspecified: Secondary | ICD-10-CM | POA: Diagnosis not present

## 2021-10-04 DIAGNOSIS — G4730 Sleep apnea, unspecified: Secondary | ICD-10-CM | POA: Diagnosis not present

## 2021-10-05 DIAGNOSIS — C3411 Malignant neoplasm of upper lobe, right bronchus or lung: Secondary | ICD-10-CM | POA: Diagnosis not present

## 2021-10-05 DIAGNOSIS — R7989 Other specified abnormal findings of blood chemistry: Secondary | ICD-10-CM | POA: Diagnosis not present

## 2021-10-05 DIAGNOSIS — C3412 Malignant neoplasm of upper lobe, left bronchus or lung: Secondary | ICD-10-CM | POA: Diagnosis not present

## 2021-10-05 DIAGNOSIS — C3491 Malignant neoplasm of unspecified part of right bronchus or lung: Secondary | ICD-10-CM | POA: Diagnosis not present

## 2021-10-12 DIAGNOSIS — I4891 Unspecified atrial fibrillation: Secondary | ICD-10-CM | POA: Diagnosis not present

## 2021-10-12 DIAGNOSIS — Z86711 Personal history of pulmonary embolism: Secondary | ICD-10-CM | POA: Diagnosis not present

## 2021-10-12 DIAGNOSIS — N529 Male erectile dysfunction, unspecified: Secondary | ICD-10-CM | POA: Diagnosis not present

## 2021-10-12 DIAGNOSIS — M1990 Unspecified osteoarthritis, unspecified site: Secondary | ICD-10-CM | POA: Diagnosis not present

## 2021-10-12 DIAGNOSIS — C3411 Malignant neoplasm of upper lobe, right bronchus or lung: Secondary | ICD-10-CM | POA: Diagnosis not present

## 2021-10-12 DIAGNOSIS — Z7901 Long term (current) use of anticoagulants: Secondary | ICD-10-CM | POA: Diagnosis not present

## 2021-10-12 DIAGNOSIS — F419 Anxiety disorder, unspecified: Secondary | ICD-10-CM | POA: Diagnosis not present

## 2021-10-12 DIAGNOSIS — Z89021 Acquired absence of right finger(s): Secondary | ICD-10-CM | POA: Diagnosis not present

## 2021-10-12 DIAGNOSIS — J449 Chronic obstructive pulmonary disease, unspecified: Secondary | ICD-10-CM | POA: Diagnosis not present

## 2021-10-12 DIAGNOSIS — E785 Hyperlipidemia, unspecified: Secondary | ICD-10-CM | POA: Diagnosis not present

## 2021-10-12 DIAGNOSIS — F17210 Nicotine dependence, cigarettes, uncomplicated: Secondary | ICD-10-CM | POA: Diagnosis not present

## 2021-10-12 DIAGNOSIS — R69 Illness, unspecified: Secondary | ICD-10-CM | POA: Diagnosis not present

## 2021-10-12 DIAGNOSIS — G4730 Sleep apnea, unspecified: Secondary | ICD-10-CM | POA: Diagnosis not present

## 2021-10-12 DIAGNOSIS — Z7984 Long term (current) use of oral hypoglycemic drugs: Secondary | ICD-10-CM | POA: Diagnosis not present

## 2021-10-12 DIAGNOSIS — Z7951 Long term (current) use of inhaled steroids: Secondary | ICD-10-CM | POA: Diagnosis not present

## 2021-10-12 DIAGNOSIS — C3412 Malignant neoplasm of upper lobe, left bronchus or lung: Secondary | ICD-10-CM | POA: Diagnosis not present

## 2021-10-12 DIAGNOSIS — Z794 Long term (current) use of insulin: Secondary | ICD-10-CM | POA: Diagnosis not present

## 2021-10-12 DIAGNOSIS — I10 Essential (primary) hypertension: Secondary | ICD-10-CM | POA: Diagnosis not present

## 2021-10-12 DIAGNOSIS — E119 Type 2 diabetes mellitus without complications: Secondary | ICD-10-CM | POA: Diagnosis not present

## 2021-10-13 DIAGNOSIS — E785 Hyperlipidemia, unspecified: Secondary | ICD-10-CM | POA: Diagnosis not present

## 2021-10-13 DIAGNOSIS — Z7901 Long term (current) use of anticoagulants: Secondary | ICD-10-CM | POA: Diagnosis not present

## 2021-10-13 DIAGNOSIS — I10 Essential (primary) hypertension: Secondary | ICD-10-CM | POA: Diagnosis not present

## 2021-10-13 DIAGNOSIS — Z7984 Long term (current) use of oral hypoglycemic drugs: Secondary | ICD-10-CM | POA: Diagnosis not present

## 2021-10-13 DIAGNOSIS — E119 Type 2 diabetes mellitus without complications: Secondary | ICD-10-CM | POA: Diagnosis not present

## 2021-10-13 DIAGNOSIS — J449 Chronic obstructive pulmonary disease, unspecified: Secondary | ICD-10-CM | POA: Diagnosis not present

## 2021-10-13 DIAGNOSIS — N529 Male erectile dysfunction, unspecified: Secondary | ICD-10-CM | POA: Diagnosis not present

## 2021-10-13 DIAGNOSIS — Z794 Long term (current) use of insulin: Secondary | ICD-10-CM | POA: Diagnosis not present

## 2021-10-13 DIAGNOSIS — M1990 Unspecified osteoarthritis, unspecified site: Secondary | ICD-10-CM | POA: Diagnosis not present

## 2021-10-13 DIAGNOSIS — C3412 Malignant neoplasm of upper lobe, left bronchus or lung: Secondary | ICD-10-CM | POA: Diagnosis not present

## 2021-10-13 DIAGNOSIS — G4730 Sleep apnea, unspecified: Secondary | ICD-10-CM | POA: Diagnosis not present

## 2021-10-13 DIAGNOSIS — C3411 Malignant neoplasm of upper lobe, right bronchus or lung: Secondary | ICD-10-CM | POA: Diagnosis not present

## 2021-10-13 DIAGNOSIS — Z89021 Acquired absence of right finger(s): Secondary | ICD-10-CM | POA: Diagnosis not present

## 2021-10-13 DIAGNOSIS — F419 Anxiety disorder, unspecified: Secondary | ICD-10-CM | POA: Diagnosis not present

## 2021-10-13 DIAGNOSIS — Z86711 Personal history of pulmonary embolism: Secondary | ICD-10-CM | POA: Diagnosis not present

## 2021-10-13 DIAGNOSIS — Z7951 Long term (current) use of inhaled steroids: Secondary | ICD-10-CM | POA: Diagnosis not present

## 2021-10-13 DIAGNOSIS — I4891 Unspecified atrial fibrillation: Secondary | ICD-10-CM | POA: Diagnosis not present

## 2021-10-13 DIAGNOSIS — F17210 Nicotine dependence, cigarettes, uncomplicated: Secondary | ICD-10-CM | POA: Diagnosis not present

## 2021-10-13 DIAGNOSIS — R69 Illness, unspecified: Secondary | ICD-10-CM | POA: Diagnosis not present

## 2021-10-17 DIAGNOSIS — R600 Localized edema: Secondary | ICD-10-CM | POA: Diagnosis not present

## 2021-10-17 DIAGNOSIS — R7989 Other specified abnormal findings of blood chemistry: Secondary | ICD-10-CM | POA: Diagnosis not present

## 2021-10-17 DIAGNOSIS — Z5112 Encounter for antineoplastic immunotherapy: Secondary | ICD-10-CM | POA: Diagnosis not present

## 2021-10-17 DIAGNOSIS — C3412 Malignant neoplasm of upper lobe, left bronchus or lung: Secondary | ICD-10-CM | POA: Diagnosis not present

## 2021-10-17 DIAGNOSIS — Z79899 Other long term (current) drug therapy: Secondary | ICD-10-CM | POA: Diagnosis not present

## 2021-10-17 DIAGNOSIS — C3411 Malignant neoplasm of upper lobe, right bronchus or lung: Secondary | ICD-10-CM | POA: Diagnosis not present

## 2021-10-17 DIAGNOSIS — Z923 Personal history of irradiation: Secondary | ICD-10-CM | POA: Diagnosis not present

## 2021-10-17 DIAGNOSIS — J449 Chronic obstructive pulmonary disease, unspecified: Secondary | ICD-10-CM | POA: Diagnosis not present

## 2021-10-18 DIAGNOSIS — E119 Type 2 diabetes mellitus without complications: Secondary | ICD-10-CM | POA: Diagnosis not present

## 2021-10-18 DIAGNOSIS — E785 Hyperlipidemia, unspecified: Secondary | ICD-10-CM | POA: Diagnosis not present

## 2021-10-18 DIAGNOSIS — Z794 Long term (current) use of insulin: Secondary | ICD-10-CM | POA: Diagnosis not present

## 2021-10-18 DIAGNOSIS — N529 Male erectile dysfunction, unspecified: Secondary | ICD-10-CM | POA: Diagnosis not present

## 2021-10-18 DIAGNOSIS — I10 Essential (primary) hypertension: Secondary | ICD-10-CM | POA: Diagnosis not present

## 2021-10-18 DIAGNOSIS — M1990 Unspecified osteoarthritis, unspecified site: Secondary | ICD-10-CM | POA: Diagnosis not present

## 2021-10-18 DIAGNOSIS — Z7984 Long term (current) use of oral hypoglycemic drugs: Secondary | ICD-10-CM | POA: Diagnosis not present

## 2021-10-18 DIAGNOSIS — I4891 Unspecified atrial fibrillation: Secondary | ICD-10-CM | POA: Diagnosis not present

## 2021-10-18 DIAGNOSIS — C3411 Malignant neoplasm of upper lobe, right bronchus or lung: Secondary | ICD-10-CM | POA: Diagnosis not present

## 2021-10-18 DIAGNOSIS — J449 Chronic obstructive pulmonary disease, unspecified: Secondary | ICD-10-CM | POA: Diagnosis not present

## 2021-10-18 DIAGNOSIS — Z7901 Long term (current) use of anticoagulants: Secondary | ICD-10-CM | POA: Diagnosis not present

## 2021-10-18 DIAGNOSIS — F419 Anxiety disorder, unspecified: Secondary | ICD-10-CM | POA: Diagnosis not present

## 2021-10-18 DIAGNOSIS — Z7951 Long term (current) use of inhaled steroids: Secondary | ICD-10-CM | POA: Diagnosis not present

## 2021-10-18 DIAGNOSIS — F17210 Nicotine dependence, cigarettes, uncomplicated: Secondary | ICD-10-CM | POA: Diagnosis not present

## 2021-10-18 DIAGNOSIS — G4730 Sleep apnea, unspecified: Secondary | ICD-10-CM | POA: Diagnosis not present

## 2021-10-18 DIAGNOSIS — R69 Illness, unspecified: Secondary | ICD-10-CM | POA: Diagnosis not present

## 2021-10-18 DIAGNOSIS — Z89021 Acquired absence of right finger(s): Secondary | ICD-10-CM | POA: Diagnosis not present

## 2021-10-18 DIAGNOSIS — C3412 Malignant neoplasm of upper lobe, left bronchus or lung: Secondary | ICD-10-CM | POA: Diagnosis not present

## 2021-10-18 DIAGNOSIS — Z86711 Personal history of pulmonary embolism: Secondary | ICD-10-CM | POA: Diagnosis not present

## 2021-10-25 DIAGNOSIS — I519 Heart disease, unspecified: Secondary | ICD-10-CM | POA: Diagnosis not present

## 2021-10-25 DIAGNOSIS — J90 Pleural effusion, not elsewhere classified: Secondary | ICD-10-CM | POA: Diagnosis not present

## 2021-10-25 DIAGNOSIS — J9621 Acute and chronic respiratory failure with hypoxia: Secondary | ICD-10-CM | POA: Diagnosis not present

## 2021-10-25 DIAGNOSIS — I700 Atherosclerosis of aorta: Secondary | ICD-10-CM | POA: Diagnosis not present

## 2021-10-25 DIAGNOSIS — R092 Respiratory arrest: Secondary | ICD-10-CM | POA: Diagnosis not present

## 2021-10-25 DIAGNOSIS — J441 Chronic obstructive pulmonary disease with (acute) exacerbation: Secondary | ICD-10-CM | POA: Diagnosis not present

## 2021-10-26 DIAGNOSIS — E1165 Type 2 diabetes mellitus with hyperglycemia: Secondary | ICD-10-CM | POA: Diagnosis not present

## 2021-10-26 DIAGNOSIS — Z4682 Encounter for fitting and adjustment of non-vascular catheter: Secondary | ICD-10-CM | POA: Diagnosis not present

## 2021-10-26 DIAGNOSIS — R778 Other specified abnormalities of plasma proteins: Secondary | ICD-10-CM | POA: Diagnosis not present

## 2021-10-26 DIAGNOSIS — I5023 Acute on chronic systolic (congestive) heart failure: Secondary | ICD-10-CM | POA: Diagnosis not present

## 2021-10-26 DIAGNOSIS — J811 Chronic pulmonary edema: Secondary | ICD-10-CM | POA: Diagnosis not present

## 2021-10-26 DIAGNOSIS — I5030 Unspecified diastolic (congestive) heart failure: Secondary | ICD-10-CM | POA: Diagnosis not present

## 2021-10-26 DIAGNOSIS — I272 Pulmonary hypertension, unspecified: Secondary | ICD-10-CM | POA: Diagnosis not present

## 2021-10-26 DIAGNOSIS — Z86711 Personal history of pulmonary embolism: Secondary | ICD-10-CM | POA: Diagnosis not present

## 2021-10-26 DIAGNOSIS — Z9981 Dependence on supplemental oxygen: Secondary | ICD-10-CM | POA: Diagnosis not present

## 2021-10-26 DIAGNOSIS — Z7901 Long term (current) use of anticoagulants: Secondary | ICD-10-CM | POA: Diagnosis not present

## 2021-10-26 DIAGNOSIS — J431 Panlobular emphysema: Secondary | ICD-10-CM | POA: Diagnosis not present

## 2021-10-26 DIAGNOSIS — J9601 Acute respiratory failure with hypoxia: Secondary | ICD-10-CM | POA: Diagnosis not present

## 2021-10-26 DIAGNOSIS — I519 Heart disease, unspecified: Secondary | ICD-10-CM | POA: Diagnosis not present

## 2021-10-26 DIAGNOSIS — J449 Chronic obstructive pulmonary disease, unspecified: Secondary | ICD-10-CM | POA: Diagnosis not present

## 2021-10-26 DIAGNOSIS — G4733 Obstructive sleep apnea (adult) (pediatric): Secondary | ICD-10-CM | POA: Diagnosis not present

## 2021-10-26 DIAGNOSIS — I5033 Acute on chronic diastolic (congestive) heart failure: Secondary | ICD-10-CM | POA: Diagnosis not present

## 2021-10-26 DIAGNOSIS — I130 Hypertensive heart and chronic kidney disease with heart failure and stage 1 through stage 4 chronic kidney disease, or unspecified chronic kidney disease: Secondary | ICD-10-CM | POA: Diagnosis not present

## 2021-10-26 DIAGNOSIS — Z66 Do not resuscitate: Secondary | ICD-10-CM | POA: Diagnosis not present

## 2021-10-26 DIAGNOSIS — E662 Morbid (severe) obesity with alveolar hypoventilation: Secondary | ICD-10-CM | POA: Diagnosis not present

## 2021-10-26 DIAGNOSIS — N179 Acute kidney failure, unspecified: Secondary | ICD-10-CM | POA: Diagnosis not present

## 2021-10-26 DIAGNOSIS — I248 Other forms of acute ischemic heart disease: Secondary | ICD-10-CM | POA: Diagnosis not present

## 2021-10-26 DIAGNOSIS — J9611 Chronic respiratory failure with hypoxia: Secondary | ICD-10-CM | POA: Diagnosis not present

## 2021-10-26 DIAGNOSIS — J189 Pneumonia, unspecified organism: Secondary | ICD-10-CM | POA: Diagnosis not present

## 2021-10-26 DIAGNOSIS — R918 Other nonspecific abnormal finding of lung field: Secondary | ICD-10-CM | POA: Diagnosis not present

## 2021-10-26 DIAGNOSIS — N184 Chronic kidney disease, stage 4 (severe): Secondary | ICD-10-CM | POA: Diagnosis not present

## 2021-10-26 DIAGNOSIS — E119 Type 2 diabetes mellitus without complications: Secondary | ICD-10-CM | POA: Diagnosis not present

## 2021-10-26 DIAGNOSIS — E872 Acidosis, unspecified: Secondary | ICD-10-CM | POA: Diagnosis not present

## 2021-10-26 DIAGNOSIS — I700 Atherosclerosis of aorta: Secondary | ICD-10-CM | POA: Diagnosis not present

## 2021-10-26 DIAGNOSIS — N189 Chronic kidney disease, unspecified: Secondary | ICD-10-CM | POA: Diagnosis not present

## 2021-10-26 DIAGNOSIS — J90 Pleural effusion, not elsewhere classified: Secondary | ICD-10-CM | POA: Diagnosis not present

## 2021-10-26 DIAGNOSIS — I4891 Unspecified atrial fibrillation: Secondary | ICD-10-CM | POA: Diagnosis not present

## 2021-10-26 DIAGNOSIS — C3490 Malignant neoplasm of unspecified part of unspecified bronchus or lung: Secondary | ICD-10-CM | POA: Diagnosis not present

## 2021-10-26 DIAGNOSIS — J9811 Atelectasis: Secondary | ICD-10-CM | POA: Diagnosis not present

## 2021-10-26 DIAGNOSIS — C3411 Malignant neoplasm of upper lobe, right bronchus or lung: Secondary | ICD-10-CM | POA: Diagnosis not present

## 2021-10-26 DIAGNOSIS — J9621 Acute and chronic respiratory failure with hypoxia: Secondary | ICD-10-CM | POA: Diagnosis not present

## 2021-10-26 DIAGNOSIS — J441 Chronic obstructive pulmonary disease with (acute) exacerbation: Secondary | ICD-10-CM | POA: Diagnosis not present

## 2021-11-08 DIAGNOSIS — Z86711 Personal history of pulmonary embolism: Secondary | ICD-10-CM | POA: Diagnosis not present

## 2021-11-08 DIAGNOSIS — I10 Essential (primary) hypertension: Secondary | ICD-10-CM | POA: Diagnosis not present

## 2021-11-08 DIAGNOSIS — J449 Chronic obstructive pulmonary disease, unspecified: Secondary | ICD-10-CM | POA: Diagnosis not present

## 2021-11-08 DIAGNOSIS — Z7951 Long term (current) use of inhaled steroids: Secondary | ICD-10-CM | POA: Diagnosis not present

## 2021-11-08 DIAGNOSIS — I4891 Unspecified atrial fibrillation: Secondary | ICD-10-CM | POA: Diagnosis not present

## 2021-11-08 DIAGNOSIS — E119 Type 2 diabetes mellitus without complications: Secondary | ICD-10-CM | POA: Diagnosis not present

## 2021-11-08 DIAGNOSIS — Z7901 Long term (current) use of anticoagulants: Secondary | ICD-10-CM | POA: Diagnosis not present

## 2021-11-08 DIAGNOSIS — N529 Male erectile dysfunction, unspecified: Secondary | ICD-10-CM | POA: Diagnosis not present

## 2021-11-08 DIAGNOSIS — R69 Illness, unspecified: Secondary | ICD-10-CM | POA: Diagnosis not present

## 2021-11-08 DIAGNOSIS — Z794 Long term (current) use of insulin: Secondary | ICD-10-CM | POA: Diagnosis not present

## 2021-11-08 DIAGNOSIS — C3412 Malignant neoplasm of upper lobe, left bronchus or lung: Secondary | ICD-10-CM | POA: Diagnosis not present

## 2021-11-08 DIAGNOSIS — E785 Hyperlipidemia, unspecified: Secondary | ICD-10-CM | POA: Diagnosis not present

## 2021-11-08 DIAGNOSIS — M1990 Unspecified osteoarthritis, unspecified site: Secondary | ICD-10-CM | POA: Diagnosis not present

## 2021-11-08 DIAGNOSIS — Z89021 Acquired absence of right finger(s): Secondary | ICD-10-CM | POA: Diagnosis not present

## 2021-11-08 DIAGNOSIS — F17210 Nicotine dependence, cigarettes, uncomplicated: Secondary | ICD-10-CM | POA: Diagnosis not present

## 2021-11-08 DIAGNOSIS — F419 Anxiety disorder, unspecified: Secondary | ICD-10-CM | POA: Diagnosis not present

## 2021-11-08 DIAGNOSIS — G4730 Sleep apnea, unspecified: Secondary | ICD-10-CM | POA: Diagnosis not present

## 2021-11-08 DIAGNOSIS — Z7984 Long term (current) use of oral hypoglycemic drugs: Secondary | ICD-10-CM | POA: Diagnosis not present

## 2021-11-08 DIAGNOSIS — C3411 Malignant neoplasm of upper lobe, right bronchus or lung: Secondary | ICD-10-CM | POA: Diagnosis not present

## 2021-11-10 DIAGNOSIS — J9811 Atelectasis: Secondary | ICD-10-CM | POA: Diagnosis not present

## 2021-11-10 DIAGNOSIS — J90 Pleural effusion, not elsewhere classified: Secondary | ICD-10-CM | POA: Diagnosis not present

## 2021-11-10 DIAGNOSIS — Z87891 Personal history of nicotine dependence: Secondary | ICD-10-CM | POA: Diagnosis not present

## 2021-11-10 DIAGNOSIS — J449 Chronic obstructive pulmonary disease, unspecified: Secondary | ICD-10-CM | POA: Diagnosis not present

## 2021-11-10 DIAGNOSIS — C3491 Malignant neoplasm of unspecified part of right bronchus or lung: Secondary | ICD-10-CM | POA: Diagnosis not present

## 2021-11-10 DIAGNOSIS — R059 Cough, unspecified: Secondary | ICD-10-CM | POA: Diagnosis not present

## 2021-11-10 DIAGNOSIS — C3412 Malignant neoplasm of upper lobe, left bronchus or lung: Secondary | ICD-10-CM | POA: Diagnosis not present

## 2021-11-10 DIAGNOSIS — Z5112 Encounter for antineoplastic immunotherapy: Secondary | ICD-10-CM | POA: Diagnosis not present

## 2021-11-10 DIAGNOSIS — I272 Pulmonary hypertension, unspecified: Secondary | ICD-10-CM | POA: Diagnosis not present

## 2021-11-10 DIAGNOSIS — J811 Chronic pulmonary edema: Secondary | ICD-10-CM | POA: Diagnosis not present

## 2021-11-10 DIAGNOSIS — C3411 Malignant neoplasm of upper lobe, right bronchus or lung: Secondary | ICD-10-CM | POA: Diagnosis not present

## 2021-11-15 DIAGNOSIS — Z7984 Long term (current) use of oral hypoglycemic drugs: Secondary | ICD-10-CM | POA: Diagnosis not present

## 2021-11-15 DIAGNOSIS — X58XXXA Exposure to other specified factors, initial encounter: Secondary | ICD-10-CM | POA: Diagnosis not present

## 2021-11-15 DIAGNOSIS — Z89021 Acquired absence of right finger(s): Secondary | ICD-10-CM | POA: Diagnosis not present

## 2021-11-15 DIAGNOSIS — I110 Hypertensive heart disease with heart failure: Secondary | ICD-10-CM | POA: Diagnosis not present

## 2021-11-15 DIAGNOSIS — Z86711 Personal history of pulmonary embolism: Secondary | ICD-10-CM | POA: Diagnosis not present

## 2021-11-15 DIAGNOSIS — N529 Male erectile dysfunction, unspecified: Secondary | ICD-10-CM | POA: Diagnosis not present

## 2021-11-15 DIAGNOSIS — Z9114 Patient's other noncompliance with medication regimen: Secondary | ICD-10-CM | POA: Diagnosis not present

## 2021-11-15 DIAGNOSIS — J9601 Acute respiratory failure with hypoxia: Secondary | ICD-10-CM | POA: Diagnosis not present

## 2021-11-15 DIAGNOSIS — M1990 Unspecified osteoarthritis, unspecified site: Secondary | ICD-10-CM | POA: Diagnosis not present

## 2021-11-15 DIAGNOSIS — I272 Pulmonary hypertension, unspecified: Secondary | ICD-10-CM | POA: Diagnosis not present

## 2021-11-15 DIAGNOSIS — R0602 Shortness of breath: Secondary | ICD-10-CM | POA: Diagnosis not present

## 2021-11-15 DIAGNOSIS — R0603 Acute respiratory distress: Secondary | ICD-10-CM | POA: Diagnosis not present

## 2021-11-15 DIAGNOSIS — Z515 Encounter for palliative care: Secondary | ICD-10-CM | POA: Diagnosis not present

## 2021-11-15 DIAGNOSIS — C4492 Squamous cell carcinoma of skin, unspecified: Secondary | ICD-10-CM | POA: Diagnosis not present

## 2021-11-15 DIAGNOSIS — E785 Hyperlipidemia, unspecified: Secondary | ICD-10-CM | POA: Diagnosis not present

## 2021-11-15 DIAGNOSIS — R1312 Dysphagia, oropharyngeal phase: Secondary | ICD-10-CM | POA: Diagnosis not present

## 2021-11-15 DIAGNOSIS — F17210 Nicotine dependence, cigarettes, uncomplicated: Secondary | ICD-10-CM | POA: Diagnosis not present

## 2021-11-15 DIAGNOSIS — J90 Pleural effusion, not elsewhere classified: Secondary | ICD-10-CM | POA: Diagnosis not present

## 2021-11-15 DIAGNOSIS — R918 Other nonspecific abnormal finding of lung field: Secondary | ICD-10-CM | POA: Diagnosis not present

## 2021-11-15 DIAGNOSIS — J9621 Acute and chronic respiratory failure with hypoxia: Secondary | ICD-10-CM | POA: Diagnosis not present

## 2021-11-15 DIAGNOSIS — R69 Illness, unspecified: Secondary | ICD-10-CM | POA: Diagnosis not present

## 2021-11-15 DIAGNOSIS — E8770 Fluid overload, unspecified: Secondary | ICD-10-CM | POA: Diagnosis not present

## 2021-11-15 DIAGNOSIS — J439 Emphysema, unspecified: Secondary | ICD-10-CM | POA: Diagnosis not present

## 2021-11-15 DIAGNOSIS — C3412 Malignant neoplasm of upper lobe, left bronchus or lung: Secondary | ICD-10-CM | POA: Diagnosis not present

## 2021-11-15 DIAGNOSIS — Z7951 Long term (current) use of inhaled steroids: Secondary | ICD-10-CM | POA: Diagnosis not present

## 2021-11-15 DIAGNOSIS — J9811 Atelectasis: Secondary | ICD-10-CM | POA: Diagnosis not present

## 2021-11-15 DIAGNOSIS — F419 Anxiety disorder, unspecified: Secondary | ICD-10-CM | POA: Diagnosis not present

## 2021-11-15 DIAGNOSIS — I2510 Atherosclerotic heart disease of native coronary artery without angina pectoris: Secondary | ICD-10-CM | POA: Diagnosis not present

## 2021-11-15 DIAGNOSIS — Z7901 Long term (current) use of anticoagulants: Secondary | ICD-10-CM | POA: Diagnosis not present

## 2021-11-15 DIAGNOSIS — J441 Chronic obstructive pulmonary disease with (acute) exacerbation: Secondary | ICD-10-CM | POA: Diagnosis not present

## 2021-11-15 DIAGNOSIS — G4730 Sleep apnea, unspecified: Secondary | ICD-10-CM | POA: Diagnosis not present

## 2021-11-15 DIAGNOSIS — I5023 Acute on chronic systolic (congestive) heart failure: Secondary | ICD-10-CM | POA: Diagnosis not present

## 2021-11-15 DIAGNOSIS — R911 Solitary pulmonary nodule: Secondary | ICD-10-CM | POA: Diagnosis not present

## 2021-11-15 DIAGNOSIS — C781 Secondary malignant neoplasm of mediastinum: Secondary | ICD-10-CM | POA: Diagnosis not present

## 2021-11-15 DIAGNOSIS — I4891 Unspecified atrial fibrillation: Secondary | ICD-10-CM | POA: Diagnosis not present

## 2021-11-15 DIAGNOSIS — E119 Type 2 diabetes mellitus without complications: Secondary | ICD-10-CM | POA: Diagnosis not present

## 2021-11-15 DIAGNOSIS — I509 Heart failure, unspecified: Secondary | ICD-10-CM | POA: Diagnosis not present

## 2021-11-15 DIAGNOSIS — N184 Chronic kidney disease, stage 4 (severe): Secondary | ICD-10-CM | POA: Diagnosis not present

## 2021-11-15 DIAGNOSIS — E1165 Type 2 diabetes mellitus with hyperglycemia: Secondary | ICD-10-CM | POA: Diagnosis not present

## 2021-11-15 DIAGNOSIS — C7802 Secondary malignant neoplasm of left lung: Secondary | ICD-10-CM | POA: Diagnosis not present

## 2021-11-15 DIAGNOSIS — J449 Chronic obstructive pulmonary disease, unspecified: Secondary | ICD-10-CM | POA: Diagnosis not present

## 2021-11-15 DIAGNOSIS — I480 Paroxysmal atrial fibrillation: Secondary | ICD-10-CM | POA: Diagnosis not present

## 2021-11-15 DIAGNOSIS — Z794 Long term (current) use of insulin: Secondary | ICD-10-CM | POA: Diagnosis not present

## 2021-11-15 DIAGNOSIS — R058 Other specified cough: Secondary | ICD-10-CM | POA: Diagnosis not present

## 2021-11-15 DIAGNOSIS — I10 Essential (primary) hypertension: Secondary | ICD-10-CM | POA: Diagnosis not present

## 2021-11-15 DIAGNOSIS — Z66 Do not resuscitate: Secondary | ICD-10-CM | POA: Diagnosis not present

## 2021-11-15 DIAGNOSIS — I130 Hypertensive heart and chronic kidney disease with heart failure and stage 1 through stage 4 chronic kidney disease, or unspecified chronic kidney disease: Secondary | ICD-10-CM | POA: Diagnosis not present

## 2021-11-15 DIAGNOSIS — C3411 Malignant neoplasm of upper lobe, right bronchus or lung: Secondary | ICD-10-CM | POA: Diagnosis not present

## 2021-11-16 DIAGNOSIS — I110 Hypertensive heart disease with heart failure: Secondary | ICD-10-CM | POA: Diagnosis not present

## 2021-11-16 DIAGNOSIS — R058 Other specified cough: Secondary | ICD-10-CM | POA: Diagnosis not present

## 2021-11-16 DIAGNOSIS — I272 Pulmonary hypertension, unspecified: Secondary | ICD-10-CM | POA: Diagnosis not present

## 2021-11-16 DIAGNOSIS — J9601 Acute respiratory failure with hypoxia: Secondary | ICD-10-CM | POA: Diagnosis not present

## 2021-11-16 DIAGNOSIS — J441 Chronic obstructive pulmonary disease with (acute) exacerbation: Secondary | ICD-10-CM | POA: Diagnosis not present

## 2021-11-16 DIAGNOSIS — J90 Pleural effusion, not elsewhere classified: Secondary | ICD-10-CM | POA: Diagnosis not present

## 2021-11-16 DIAGNOSIS — Z9114 Patient's other noncompliance with medication regimen: Secondary | ICD-10-CM | POA: Diagnosis not present

## 2021-11-16 DIAGNOSIS — I509 Heart failure, unspecified: Secondary | ICD-10-CM | POA: Diagnosis not present

## 2021-11-16 DIAGNOSIS — Z515 Encounter for palliative care: Secondary | ICD-10-CM | POA: Diagnosis not present

## 2021-11-16 DIAGNOSIS — J9811 Atelectasis: Secondary | ICD-10-CM | POA: Diagnosis not present

## 2021-11-16 DIAGNOSIS — J9621 Acute and chronic respiratory failure with hypoxia: Secondary | ICD-10-CM | POA: Diagnosis not present

## 2021-11-16 DIAGNOSIS — E1165 Type 2 diabetes mellitus with hyperglycemia: Secondary | ICD-10-CM | POA: Diagnosis not present

## 2021-11-17 DIAGNOSIS — J9601 Acute respiratory failure with hypoxia: Secondary | ICD-10-CM | POA: Diagnosis not present

## 2021-11-17 DIAGNOSIS — R058 Other specified cough: Secondary | ICD-10-CM | POA: Diagnosis not present

## 2021-11-17 DIAGNOSIS — J9621 Acute and chronic respiratory failure with hypoxia: Secondary | ICD-10-CM | POA: Diagnosis not present

## 2021-11-17 DIAGNOSIS — E1165 Type 2 diabetes mellitus with hyperglycemia: Secondary | ICD-10-CM | POA: Diagnosis not present

## 2021-11-17 DIAGNOSIS — I272 Pulmonary hypertension, unspecified: Secondary | ICD-10-CM | POA: Diagnosis not present

## 2021-11-17 DIAGNOSIS — I509 Heart failure, unspecified: Secondary | ICD-10-CM | POA: Diagnosis not present

## 2021-11-17 DIAGNOSIS — J441 Chronic obstructive pulmonary disease with (acute) exacerbation: Secondary | ICD-10-CM | POA: Diagnosis not present

## 2021-11-17 DIAGNOSIS — Z515 Encounter for palliative care: Secondary | ICD-10-CM | POA: Diagnosis not present

## 2021-11-18 DIAGNOSIS — I480 Paroxysmal atrial fibrillation: Secondary | ICD-10-CM | POA: Diagnosis not present

## 2021-11-18 DIAGNOSIS — J9621 Acute and chronic respiratory failure with hypoxia: Secondary | ICD-10-CM | POA: Diagnosis not present

## 2021-11-19 DIAGNOSIS — J9621 Acute and chronic respiratory failure with hypoxia: Secondary | ICD-10-CM | POA: Diagnosis not present

## 2021-11-20 DIAGNOSIS — J9621 Acute and chronic respiratory failure with hypoxia: Secondary | ICD-10-CM | POA: Diagnosis not present

## 2021-11-21 DIAGNOSIS — J9621 Acute and chronic respiratory failure with hypoxia: Secondary | ICD-10-CM | POA: Diagnosis not present

## 2021-11-21 DIAGNOSIS — I272 Pulmonary hypertension, unspecified: Secondary | ICD-10-CM | POA: Diagnosis not present

## 2021-11-22 DIAGNOSIS — J90 Pleural effusion, not elsewhere classified: Secondary | ICD-10-CM | POA: Diagnosis not present

## 2021-11-22 DIAGNOSIS — R911 Solitary pulmonary nodule: Secondary | ICD-10-CM | POA: Diagnosis not present

## 2021-11-22 DIAGNOSIS — J9621 Acute and chronic respiratory failure with hypoxia: Secondary | ICD-10-CM | POA: Diagnosis not present

## 2021-11-22 DIAGNOSIS — R1312 Dysphagia, oropharyngeal phase: Secondary | ICD-10-CM | POA: Diagnosis not present

## 2021-11-22 DIAGNOSIS — I2510 Atherosclerotic heart disease of native coronary artery without angina pectoris: Secondary | ICD-10-CM | POA: Diagnosis not present

## 2021-11-22 DIAGNOSIS — J439 Emphysema, unspecified: Secondary | ICD-10-CM | POA: Diagnosis not present

## 2021-11-22 DIAGNOSIS — R918 Other nonspecific abnormal finding of lung field: Secondary | ICD-10-CM | POA: Diagnosis not present

## 2021-11-23 DIAGNOSIS — J9621 Acute and chronic respiratory failure with hypoxia: Secondary | ICD-10-CM | POA: Diagnosis not present
